# Patient Record
Sex: Female | Born: 1980 | Race: Black or African American | Hispanic: No | Marital: Single | Smoking: Former smoker
Health system: Southern US, Community
[De-identification: ages and names within clinical notes are randomized; demographics above are authoritative.]

## PROBLEM LIST (undated history)

## (undated) DIAGNOSIS — D649 Anemia, unspecified: Secondary | ICD-10-CM

## (undated) DIAGNOSIS — N939 Abnormal uterine and vaginal bleeding, unspecified: Secondary | ICD-10-CM

## (undated) DIAGNOSIS — I10 Essential (primary) hypertension: Secondary | ICD-10-CM

## (undated) DIAGNOSIS — Z720 Tobacco use: Secondary | ICD-10-CM

## (undated) DIAGNOSIS — D259 Leiomyoma of uterus, unspecified: Secondary | ICD-10-CM

## (undated) HISTORY — PX: ENDOMETRIAL ABLATION W/ NOVASURE: SUR434

---
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
## 2000-08-04 ENCOUNTER — Ambulatory Visit (HOSPITAL_COMMUNITY): Admission: RE | Admit: 2000-08-04 | Discharge: 2000-08-04 | Payer: Self-pay | Admitting: *Deleted

## 2000-09-15 ENCOUNTER — Ambulatory Visit (HOSPITAL_COMMUNITY): Admission: RE | Admit: 2000-09-15 | Discharge: 2000-09-15 | Payer: Self-pay | Admitting: *Deleted

## 2000-10-31 ENCOUNTER — Inpatient Hospital Stay (HOSPITAL_COMMUNITY): Admission: AD | Admit: 2000-10-31 | Discharge: 2000-10-31 | Payer: Self-pay | Admitting: Obstetrics

## 2000-11-08 ENCOUNTER — Ambulatory Visit (HOSPITAL_COMMUNITY): Admission: RE | Admit: 2000-11-08 | Discharge: 2000-11-08 | Payer: Self-pay | Admitting: *Deleted

## 2000-11-16 ENCOUNTER — Inpatient Hospital Stay (HOSPITAL_COMMUNITY): Admission: AD | Admit: 2000-11-16 | Discharge: 2000-11-16 | Payer: Self-pay | Admitting: Obstetrics

## 2001-01-06 ENCOUNTER — Inpatient Hospital Stay (HOSPITAL_COMMUNITY): Admission: AD | Admit: 2001-01-06 | Discharge: 2001-01-06 | Payer: Self-pay | Admitting: *Deleted

## 2001-01-08 ENCOUNTER — Inpatient Hospital Stay (HOSPITAL_COMMUNITY): Admission: AD | Admit: 2001-01-08 | Discharge: 2001-01-08 | Payer: Self-pay | Admitting: Obstetrics & Gynecology

## 2001-01-09 ENCOUNTER — Encounter: Payer: Self-pay | Admitting: *Deleted

## 2001-01-09 ENCOUNTER — Inpatient Hospital Stay (HOSPITAL_COMMUNITY): Admission: AD | Admit: 2001-01-09 | Discharge: 2001-01-13 | Payer: Self-pay | Admitting: *Deleted

## 2005-11-02 ENCOUNTER — Emergency Department (HOSPITAL_COMMUNITY): Admission: EM | Admit: 2005-11-02 | Discharge: 2005-11-03 | Payer: Self-pay | Admitting: Emergency Medicine

## 2005-11-28 ENCOUNTER — Emergency Department (HOSPITAL_COMMUNITY): Admission: AD | Admit: 2005-11-28 | Discharge: 2005-11-28 | Payer: Self-pay | Admitting: Emergency Medicine

## 2016-02-07 ENCOUNTER — Observation Stay (HOSPITAL_COMMUNITY)
Admission: EM | Admit: 2016-02-07 | Discharge: 2016-02-08 | Disposition: A | Discharge status: Home / Self Care | Payer: Self-pay | Attending: Family Medicine | Admitting: Family Medicine

## 2016-02-07 ENCOUNTER — Emergency Department (HOSPITAL_COMMUNITY): Payer: Self-pay

## 2016-02-07 ENCOUNTER — Emergency Department (INDEPENDENT_AMBULATORY_CARE_PROVIDER_SITE_OTHER)
Admission: EM | Admit: 2016-02-07 | Discharge: 2016-02-07 | Disposition: A | Discharge status: Nursing Home (Includes ICF) | Payer: Self-pay | Source: Home / Self Care | Attending: Family Medicine | Admitting: Family Medicine

## 2016-02-07 ENCOUNTER — Encounter (HOSPITAL_COMMUNITY): Payer: Self-pay | Admitting: *Deleted

## 2016-02-07 ENCOUNTER — Encounter (HOSPITAL_COMMUNITY): Payer: Self-pay

## 2016-02-07 DIAGNOSIS — N946 Dysmenorrhea, unspecified: Secondary | ICD-10-CM | POA: Insufficient documentation

## 2016-02-07 DIAGNOSIS — D500 Iron deficiency anemia secondary to blood loss (chronic): Principal | ICD-10-CM | POA: Insufficient documentation

## 2016-02-07 DIAGNOSIS — F17210 Nicotine dependence, cigarettes, uncomplicated: Secondary | ICD-10-CM | POA: Insufficient documentation

## 2016-02-07 DIAGNOSIS — D72829 Elevated white blood cell count, unspecified: Secondary | ICD-10-CM | POA: Insufficient documentation

## 2016-02-07 DIAGNOSIS — R0789 Other chest pain: Secondary | ICD-10-CM

## 2016-02-07 DIAGNOSIS — N920 Excessive and frequent menstruation with regular cycle: Secondary | ICD-10-CM | POA: Insufficient documentation

## 2016-02-07 DIAGNOSIS — I10 Essential (primary) hypertension: Secondary | ICD-10-CM | POA: Insufficient documentation

## 2016-02-07 DIAGNOSIS — R05 Cough: Secondary | ICD-10-CM | POA: Insufficient documentation

## 2016-02-07 DIAGNOSIS — R0609 Other forms of dyspnea: Secondary | ICD-10-CM

## 2016-02-07 DIAGNOSIS — D573 Sickle-cell trait: Secondary | ICD-10-CM | POA: Insufficient documentation

## 2016-02-07 DIAGNOSIS — D649 Anemia, unspecified: Secondary | ICD-10-CM

## 2016-02-07 DIAGNOSIS — D696 Thrombocytopenia, unspecified: Secondary | ICD-10-CM | POA: Insufficient documentation

## 2016-02-07 DIAGNOSIS — R000 Tachycardia, unspecified: Secondary | ICD-10-CM

## 2016-02-07 DIAGNOSIS — D509 Iron deficiency anemia, unspecified: Secondary | ICD-10-CM | POA: Insufficient documentation

## 2016-02-07 DIAGNOSIS — D259 Leiomyoma of uterus, unspecified: Secondary | ICD-10-CM | POA: Insufficient documentation

## 2016-02-07 DIAGNOSIS — R531 Weakness: Secondary | ICD-10-CM

## 2016-02-07 DIAGNOSIS — R9431 Abnormal electrocardiogram [ECG] [EKG]: Secondary | ICD-10-CM

## 2016-02-07 HISTORY — DX: Essential (primary) hypertension: I10

## 2016-02-07 LAB — CBC WITH DIFFERENTIAL/PLATELET
BASOS ABS: 0.1 10*3/uL (ref 0.0–0.1)
BASOS PCT: 1 %
EOS PCT: 2 %
Eosinophils Absolute: 0.3 10*3/uL (ref 0.0–0.7)
HEMATOCRIT: 16.2 % — AB (ref 36.0–46.0)
HEMOGLOBIN: 4.5 g/dL — AB (ref 12.0–15.0)
LYMPHS ABS: 2.1 10*3/uL (ref 0.7–4.0)
Lymphocytes Relative: 15 %
MCH: 18.1 pg — AB (ref 26.0–34.0)
MCHC: 27.8 g/dL — ABNORMAL LOW (ref 30.0–36.0)
MCV: 65.3 fL — AB (ref 78.0–100.0)
MONOS PCT: 7 %
Monocytes Absolute: 1 10*3/uL (ref 0.1–1.0)
NEUTROS ABS: 10.8 10*3/uL — AB (ref 1.7–7.7)
Neutrophils Relative %: 75 %
Platelets: 139 10*3/uL — ABNORMAL LOW (ref 150–400)
RBC: 2.48 MIL/uL — ABNORMAL LOW (ref 3.87–5.11)
RDW: 32 % — ABNORMAL HIGH (ref 11.5–15.5)
WBC: 14.3 10*3/uL — ABNORMAL HIGH (ref 4.0–10.5)

## 2016-02-07 LAB — I-STAT TROPONIN, ED: Troponin i, poc: 0 ng/mL (ref 0.00–0.08)

## 2016-02-07 LAB — BASIC METABOLIC PANEL
Anion gap: 9 (ref 5–15)
BUN: 6 mg/dL (ref 6–20)
CALCIUM: 8.8 mg/dL — AB (ref 8.9–10.3)
CO2: 21 mmol/L — AB (ref 22–32)
CREATININE: 0.69 mg/dL (ref 0.44–1.00)
Chloride: 110 mmol/L (ref 101–111)
GFR calc non Af Amer: >60 mL/min (ref >60)
Glucose, Bld: 98 mg/dL (ref 65–99)
Potassium: 4.1 mmol/L (ref 3.5–5.1)
SODIUM: 140 mmol/L (ref 135–145)

## 2016-02-07 LAB — SAVE SMEAR

## 2016-02-07 LAB — POCT I-STAT, CHEM 8
BUN: 4 mg/dL — AB (ref 6–20)
CALCIUM ION: 1.23 mmol/L (ref 1.12–1.23)
Chloride: 107 mmol/L (ref 101–111)
Creatinine, Ser: 0.7 mg/dL (ref 0.44–1.00)
GLUCOSE: 121 mg/dL — AB (ref 65–99)
HEMATOCRIT: 19 % — AB (ref 36.0–46.0)
Hemoglobin: 6.5 g/dL — CL (ref 12.0–15.0)
Potassium: 3.5 mmol/L (ref 3.5–5.1)
SODIUM: 140 mmol/L (ref 135–145)
TCO2: 23 mmol/L (ref 0–100)

## 2016-02-07 LAB — RETICULOCYTES
RBC.: 2.51 MIL/uL — AB (ref 3.87–5.11)
Retic Count, Absolute: 75.3 10*3/uL (ref 19.0–186.0)
Retic Ct Pct: 3 % (ref 0.4–3.1)

## 2016-02-07 LAB — PREPARE RBC (CROSSMATCH)

## 2016-02-07 LAB — ABO/RH: ABO/RH(D): O POS

## 2016-02-07 MED ORDER — AMLODIPINE BESYLATE 5 MG PO TABS
5.0000 mg | ORAL_TABLET | Freq: Every day | ORAL | Status: DC
  Administered 2016-02-08: 5 mg via ORAL
  Filled 2016-02-07 (×2): qty 1

## 2016-02-07 MED ORDER — ACETAMINOPHEN 325 MG PO TABS
650.0000 mg | ORAL_TABLET | Freq: Four times a day (QID) | ORAL | Status: DC | PRN
  Administered 2016-02-07: 650 mg via ORAL
  Filled 2016-02-07: qty 2

## 2016-02-07 MED ORDER — DESOGESTREL-ETHINYL ESTRADIOL 0.15-0.02/0.01 MG (21/5) PO TABS: 1.0000 | ORAL_TABLET | Freq: Every day | ORAL | Status: DC

## 2016-02-07 MED ORDER — SODIUM CHLORIDE 0.9 % IV SOLN
Freq: Once | INTRAVENOUS | Status: AC
  Administered 2016-02-07: 14:00:00 via INTRAVENOUS

## 2016-02-07 MED ORDER — SODIUM CHLORIDE 0.9 % IV SOLN
Freq: Once | INTRAVENOUS | Status: AC
  Administered 2016-02-07: 19:00:00 via INTRAVENOUS

## 2016-02-07 MED ORDER — ACETAMINOPHEN 650 MG RE SUPP
650.0000 mg | Freq: Four times a day (QID) | RECTAL | Status: DC | PRN
Start: 2016-02-07 — End: 2016-02-08

## 2016-02-07 MED ORDER — ONDANSETRON HCL 4 MG/2ML IJ SOLN: 4.0000 mg | Freq: Four times a day (QID) | INTRAMUSCULAR | Status: DC | PRN

## 2016-02-07 MED ORDER — ONDANSETRON HCL 4 MG PO TABS: 4.0000 mg | ORAL_TABLET | Freq: Four times a day (QID) | ORAL | Status: DC | PRN

## 2016-02-07 NOTE — H&P (Signed)
[facility] Admission History and Physical Service Pager: [phone]  Patient name: Cynthia Horn Medical record number: [id number] Date of birth: 10-17-1981 Age: 35 y.o. Gender: female  Primary Care Provider: No PCP Per Patient Consultants: None Code Status: Full (confirmed on admission)  Chief Complaint: fatigue, exertional dyspnea  Assessment and Plan: Cynthia Horn is a 35 y.o. female presenting with symptomatic anemia. PMH is significant for dysmenorrhagia, HTN, fibroids, sickle cell trait and tobacco abuse.   Severe acute on chronic blood loss anemia, suspected 2/2 to chronic menorrhagia + uterine fibroids Most likely chronic due to heavy periods and iron deficiency anemia without supplementation. Now concern on admit with worsening symptomatic anemia, however seems acute over past 1 week with menstrual cycle, unclear why she has not been significantly symptomatic prior. On admit, hemodynamically stable, without active CP/SOB at rest or syncopal episode. - Admit on telemetry, attending Dr. Erin Hearing.  - Continuous cardiac monitoring - Transfusion with 2 units pRBCs in process; (transfusion threshold of 7) - Check post-transfusion CBC 0500 (about 2 hours after 2nd unit finished) - Consider repeat CBC q12 hours  - Obtain peripheral smear, reticulocytes (called lab and will obtain from pre-transfusion blood samples) - Ordered iron studies (ferritin, TIBC, iron panel - post-transfusion) - Will not continue OCP, as patient has not taken for a week - Patient may need further work-up of fibroids. May ultimately want to consider hysterectomy.  H/o chronic iron deficiency anemia - See above ABLA  HTN - Stable - Continue home amlodipine 5 mg daily  Tobacco abuse: Patient has cut back to 1-2 cigarettes daily, from 6-7. She is motivated by her son's concerns for her health and because she has noticed she coughs with smoking now.  - Provide further counseling and  options for cessation aids - Concern for age 35 yr female smoker and with HTN on OCPs, would strongly recommend against given no significant improvement in menorrhagia  Discharge planning: Patient recently moved to [place] from [place] 2 weeks ago. She does not have a current gynecologist or PCP. Has been on Medicaid in the past but is unsure if she is currently covered. - Consult SW for assistance in confirming insurance status and provider options  FEN/GI: Regular diet, zofran prn Prophylaxis: SCDs (holding chemical VTE prophylaxis given acute anemia, however if Hgb stable and prolonged   Disposition: Admitted to observation on telemetry for PRBC transfusion x 2 with severe acute on chronic blood loss anemia, anticipate will need IV iron, may need additional transfusions, pending improvement discharge home.  History of Present Illness:  Cynthia Horn is a 35 y.o. female presenting with worsening fatigue and extertional dyspnea x 3 days. She initially presented to Urgent Care but was told to come to the ED for an i-stat hgb of 6.5.   She reports that symptoms started about 1 week ago with gradually worsening constellation of symptoms, including dizziness, dyspnea, chest tightness, and tachycardia on exertion (walking) but not at rest. The symptoms have been worsening for past 3-4 days with DOE (noticeable on trip to mall with son with increased walking) and have been associated with increased fatigue and sleepiness, nausea (without vomiting), headache, dizziness (worse on standing), and difficulty with prolonged standing. She also notes reduced appetite recently and had only been eating about once daily for the past few days due to nausea. She admits to symptoms of pica with eating ice regularly. She speculated that her BP was elevated and tried home remedies of eating peanut butter and  teaspoons of vinegar without improvement in symptoms, as well as continuing her amlodipine 5 mg daily.   No  prior history of blood transfusion.  She recently finished her menstrual cycle yesterday, 02/06/16. She reports a chronic history of "very heavy menstrual cycles," and had been followed by GYN Dr. Irine Seal in [place] (last visit 09/2015). She had a NovaSure ablation without improvement in dysmenorrhea. She was told she could not get an IUD due to ablation and have been on OCPs since 10/2015, again without improvement. She states that she buys super tampons and uses 50 over 3 days, as well as pads because she bleeds through. She describes her period as always having been this heavy. Also with prior diagnosis of uterine fibroids on prior ultrasounds, per patient.   She has a history of chronic anemia and has tried OTC iron supplements in the past but did not tolerate them well due to constipation and GI upset. Does eat meats, including red meat, though has fish and chicken more often.   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   In the ED, 2 units of blood were ordered, and transfusion was begun. WBC was 14.3, hgb 4.5, hct 16.2, plts 139. I-stat troponin was performed for complaint of chest tightness. This was negative, and EKG showed sinus tachycardia and borderline T-wave abnormalities that appear consistent with a repolarization abnormality.    Review Of Systems: Per HPI with the following additions: Admits to heart racing with activity, dark urine but still yellow but did admit to one episode of questionable blood tinged urine in toilet while mestruating. Denies any melena or hematochezia, falling, syncope, epistaxis, easy bleeding or bruising, abdominal pain.   Otherwise the remainder of the systems were negative.  Patient Active Problem List   Diagnosis Date Noted  . Symptomatic anemia 02/07/2016    Past Medical History: Past Medical History  Diagnosis Date  . Hypertension   History of sickle  cell trait. Denies any known prior history of DVT/PE.  Past Surgical History: Past Surgical History  Procedure Laterality Date  . Endometrial ablation w/ novasure    Surgical history of C-section without significant bleeding complications.  Social History: Social History  Substance Use Topics  . Smoking status: Current Every Day Smoker  . Smokeless tobacco: None  . Alcohol Use: Yes     Comment: occasional   Additional social history: Works as IT sales professional for [employer]. Active smoker. History of occasional marijuana use.  Please also refer to relevant sections of EMR.  Family History: No family history on file.   Family history of sister with CVA during pregnancy. No family history of DVT/PE. Aunt has lupus. No family history of DM.  Allergies and Medications: No Known Allergies No current facility-administered medications on file prior to encounter.   Current Outpatient Prescriptions on File Prior to Encounter  Medication Sig Dispense Refill  . AMLODIPINE BESYLATE PO Take 5 mg by mouth daily.       Objective: BP 136/92 mmHg  Pulse 104  Temp(Src) 98.7 F (37.1 C) (Oral)  Resp 18  Ht 4' 1.32" (1.253 m)  Wt 165 lb 12.6 oz (75.2 kg)  BMI 47.90 kg/m2  SpO2 100%  LMP 01/30/2016 Exam: General: Tired-appearing female, resting in bed with blood transfusion underway Eyes: Pale conjunctiva, under-eye circles, PERRL, EOMI. ENTM: No rhinorrhea. MMM. Neck: FROM, supple.  Cardiovascular: Slightly tachycardic, regular  rhythm, no m/r/g Chest: CTAB, speaking fluidly in complete sentences but pausing to catch her breath in between words; mild central chest wall tenderness with palpation Abdomen:+BS, S, NT, ND MSK: Normal bulk and tone. Moves all extremities spontaneously.  Skin: WWP. Brisk capillary refill.  Neuro: AOx3. No focal deficits.  Psych: Normal mood and affect.   Labs and Imaging: CBC BMET   Recent Labs Lab 02/07/16 1626  WBC 14.3*  HGB 4.5*  HCT  16.2*  PLT 139*    Recent Labs Lab 02/07/16 1626  NA 140  K 4.1  CL 110  CO2 21*  BUN 6  CREATININE 0.69  GLUCOSE 98  CALCIUM 8.8*      Cynthia Corinda Gubler, MD 02/07/2016, 11:08 PM PGY-1, [place] Intern pager: [phone], text pages welcome  Upper Level Addendum:  I have seen and evaluated this patient along with Dr. Ola Spurr and reviewed the above note, making necessary revisions in purple.  Cynthia Horn, [place], PGY-3

## 2016-02-07 NOTE — ED Notes (Signed)
C/O intermittent frontal HA with feeling of near-syncope over past 2 days with nausea.  Denies any cold sxs.

## 2016-02-07 NOTE — ED Provider Notes (Signed)
CSN: [id number]     Arrival date & time 02/07/16  1502 History   First MD Initiated Contact with Patient 02/07/16 1520     Chief Complaint  Patient presents with  . Chest Pain  . Low Hgb      (Consider location/radiation/quality/duration/timing/severity/associated sxs/prior Treatment) HPI   Cynthia Horn is a 35 y.o. female with PMH significant for HTN who presents from [facility] for symptomatic hgb 6.5.  Patient reports 2 day history of His pain, exertional SOB, lightheadedness, and headache. Occasional nausea. Denies cough, fever, chills, epistaxis, vomiting, abdominal pain, melena, hematochezia. Patient reports history of heavy menstrual cycles. LMP ended yesterday. She reports her periods are typically 7 days. She states that she has heavy bleeding with the passage of clots. She reports using super plus tampons and pads. She states that she can go through a 50 pack in 3 days. She reports she has to change her tampon at least every hour. She denies any history of DVT/PE, unilateral leg swelling, recent travel, immobilization, or surgery.   Past Medical History  Diagnosis Date  . Hypertension    Past Surgical History  Procedure Laterality Date  . Endometrial ablation w/ novasure     No family history on file. Social History  Substance Use Topics  . Smoking status: Current Every Day Smoker  . Smokeless tobacco: None  . Alcohol Use: Yes     Comment: occasional   OB History    No data available     Review of Systems All other systems negative unless otherwise stated in HPI    Allergies  Review of patient's allergies indicates no known allergies.  Home Medications   Prior to Admission medications   Medication Sig Start Date End Date Taking? Authorizing Provider  AMLODIPINE BESYLATE PO Take 5 mg by mouth daily.    Yes Historical Provider, MD  desogestrel-ethinyl estradiol (VIORELE) 0.15-0.02/0.01 MG (21/5) tablet Take 1 tablet by mouth daily.   Yes Historical Provider, MD    BP 107/65 mmHg  Pulse 98  Resp 21  SpO2 100%  LMP 01/30/2016 Physical Exam  Constitutional: She is oriented to person, place, and time. She appears well-developed and well-nourished.  Non-toxic appearance. She does not have a sickly appearance. She does not appear ill.  HENT:  Head: Normocephalic and atraumatic.  Mouth/Throat: Oropharynx is clear and moist.  Eyes: Conjunctivae are normal.  Neck: Normal range of motion. Neck supple.  Cardiovascular: Regular rhythm and normal heart sounds.  Tachycardia present.   No murmur heard. Pulmonary/Chest: Effort normal and breath sounds normal. No accessory muscle usage or stridor. No respiratory distress. She has no wheezes. She has no rhonchi. She has no rales.  Abdominal: Soft. Bowel sounds are normal. She exhibits no distension. There is no tenderness.  Musculoskeletal: Normal range of motion.  Lymphadenopathy:    She has no cervical adenopathy.  Neurological: She is alert and oriented to person, place, and time.  Speech clear without dysarthria.  Skin: Skin is warm and dry. There is pallor.  Psychiatric: She has a normal mood and affect. Her behavior is normal.    ED Course  Procedures (including critical care time)  CRITICAL CARE Performed by: Gloriann Loan   Total critical care time: 30 minutes  Critical care time was exclusive of separately billable procedures and treating other patients.  Critical care was necessary to treat or prevent imminent or life-threatening deterioration.  Critical care was time spent personally by me on the following activities: development of treatment  plan with patient and/or surrogate as well as nursing, discussions with consultants, evaluation of patient's response to treatment, examination of patient, obtaining history from patient or surrogate, ordering and performing treatments and interventions, ordering and review of laboratory studies, ordering and review of radiographic studies, pulse oximetry  and re-evaluation of patient's condition.  Labs Review Labs Reviewed  BASIC METABOLIC PANEL - Abnormal; Notable for the following:    CO2 21 (*)    Calcium 8.8 (*)    All other components within normal limits  CBC WITH DIFFERENTIAL/PLATELET  I-STAT TROPOININ, ED  PREPARE RBC (CROSSMATCH)  TYPE AND SCREEN    Imaging Review Dg Chest 2 View  02/07/2016  CLINICAL DATA:  Three-day history of shortness of breath with chest tightness EXAM: CHEST  2 VIEW COMPARISON:  None. FINDINGS: Lungs are clear. Heart size and pulmonary vascularity are normal. No adenopathy. No pneumothorax. No bone lesions. IMPRESSION: No abnormality noted. Electronically Signed   By: Lowella Grip III M.D.   On: 02/07/2016 16:28   I have personally reviewed and evaluated these images and lab results as part of my medical decision-making.   EKG Interpretation   Date/Time:  Saturday February 07 2016 15:19:43 EDT Ventricular Rate:  100 PR Interval:  141 QRS Duration: 88 QT Interval:  343 QTC Calculation: 442 R Axis:   61 Text Interpretation:  Sinus tachycardia Borderline T wave abnormalities No  significant change since last tracing Confirmed by [place]  MD, [place]  ([id number]) on 02/07/2016 5:15:02 PM      MDM   Final diagnoses:  Symptomatic anemia   Patient presents with symptomatic anemia, hgb at [facility] 6.5. Hgb 4.5 here.  EKG shows shows sinus tachycardia with t wave abnormalities.  Troponin 0.00.  Likely secondary to hgb of 4.5.  CXR negative.  On exam, she is tachycardic with normal heart sounds, lungs CTAB, abdomen soft and benign.  Plan to transfuse 2 units PRBCs and admit to medicine, Dr. Erin Hearing.  Case has been discussed with Dr. Wilson Singer who agrees with the above plan for admission.       Gloriann Loan, PA-C 02/07/16 [place], MD 02/12/16 [phone]

## 2016-02-07 NOTE — ED Provider Notes (Signed)
CSN: [id number]     Arrival date & time 02/07/16  1301 History   First MD Initiated Contact with Patient 02/07/16 1313     Chief Complaint  Patient presents with  . Headache  . Dizziness   (Consider location/radiation/quality/duration/timing/severity/associated sxs/prior Treatment) HPI Comments: 35 year old female with several complaints. She complains of dizziness, frontal headache, lightheadedness for 2 days. Most of the symptoms are intermittent. She states she is also under a lot of stress. She has a history of hypertension and her blood pressure was taken by a relative who was a CNA and told that her blood pressure was elevated. She had run out of her amlodipine and not been taking them for some time. She has recently started taking the amlodipine 5 mg. She denies upper respiratory symptoms. Recently she developed dyspnea on exertion as well has chest pain described as tightness. The symptoms primarily occur after walking a long distance. She is a smoker daily, and has a history of hypertension with prolonged periods of no treatment.   Past Medical History  Diagnosis Date  . Hypertension    Past Surgical History  Procedure Laterality Date  . Endometrial ablation w/ novasure     No family history on file. Social History  Substance Use Topics  . Smoking status: Current Every Day Smoker  . Smokeless tobacco: None  . Alcohol Use: Yes     Comment: occasional   OB History    No data available     Review of Systems  Constitutional: Positive for activity change. Negative for fever, chills and diaphoresis.  HENT: Negative for congestion, ear pain, postnasal drip, rhinorrhea, sore throat and trouble swallowing.   Eyes: Negative for photophobia, pain, discharge and redness.       She states sometimes she might have a brief period of blurred vision.  Respiratory: Negative for cough, choking, wheezing and stridor.   Cardiovascular: Positive for chest pain. Negative for palpitations and  leg swelling.       Complains of DOE and chest heaviness with exertion particularly after long walks.  Gastrointestinal: Positive for nausea. Negative for vomiting and abdominal pain.  Genitourinary: Negative.   Musculoskeletal: Negative for myalgias, back pain, joint swelling, gait problem, neck pain and neck stiffness.  Skin: Negative for color change and rash.  Neurological: Positive for dizziness, light-headedness and headaches. Negative for tremors, seizures, syncope, speech difficulty, weakness and numbness.  Psychiatric/Behavioral: Negative for confusion and agitation.    Allergies  Review of patient's allergies indicates no known allergies.  Home Medications   Prior to Admission medications   Medication Sig Start Date End Date Taking? Authorizing Provider  AMLODIPINE BESYLATE PO Take by mouth daily.   Yes Historical Provider, MD   Meds Ordered and Administered this Visit   Medications  0.9 %  sodium chloride infusion ( Intravenous New Bag/Given 02/07/16 1422)    BP 136/84 mmHg  Pulse 116  Temp(Src) 98.2 F (36.8 C) (Oral)  Resp 18  SpO2 100%  LMP 02/06/2016 (Exact Date) Orthostatic VS for the past 24 hrs:  BP- Lying Pulse- Lying BP- Sitting Pulse- Sitting BP- Standing at 0 minutes Pulse- Standing at 0 minutes  02/07/16 1331 129/76 mmHg 113 129/79 mmHg 114 111/76 mmHg 119    Physical Exam  Constitutional: She is oriented to person, place, and time. She appears well-developed and well-nourished. No distress.  HENT:  Head: Normocephalic and atraumatic.  Mouth/Throat: Oropharynx is clear and moist. No oropharyngeal exudate.  Bilateral TMs are obscured by wax.  Manual pressure across the forehead and right temple produces tenderness.    Eyes: Conjunctivae and EOM are normal. Pupils are equal, round, and reactive to light.  No nystagmus. Pupils small but reactive to light.  Neck: Normal range of motion. Neck supple. No thyromegaly present.  No cervical tenderness.  Exhibits full range of motion head and neck.  Cardiovascular: Regular rhythm, normal heart sounds and intact distal pulses.   Apical pulse tachycardia at 116.  Pulmonary/Chest: Effort normal and breath sounds normal. No respiratory distress. She has no wheezes. She has no rales.  Abdominal: Soft. There is no tenderness. There is no rebound and no guarding.  Musculoskeletal: Normal range of motion. She exhibits no edema or tenderness.  Lymphadenopathy:    She has no cervical adenopathy.  Neurological: She is alert and oriented to person, place, and time. No cranial nerve deficit.  Skin: Skin is warm and dry. She is not diaphoretic.  Psychiatric: She has a normal mood and affect.  Nursing note and vitals reviewed.   ED Course  Procedures (including critical care time)  Labs Review Labs Reviewed  POCT I-STAT, CHEM 8 - Abnormal; Notable for the following:    BUN 4 (*)    Glucose, Bld 121 (*)    Hemoglobin 6.5 (*)    HCT 19.0 (*)    All other components within normal limits    Imaging Review No results found. Results for orders placed or performed during the hospital encounter of 02/07/16  I-STAT, chem 8  Result Value Ref Range   Sodium 140 135 - 145 mmol/L   Potassium 3.5 3.5 - 5.1 mmol/L   Chloride 107 101 - 111 mmol/L   BUN 4 (L) 6 - 20 mg/dL   Creatinine, Ser 0.70 0.44 - 1.00 mg/dL   Glucose, Bld 121 (H) 65 - 99 mg/dL   Calcium, Ion 1.23 1.12 - 1.23 mmol/L   TCO2 23 0 - 100 mmol/L   Hemoglobin 6.5 (LL) 12.0 - 15.0 g/dL   HCT 19.0 (L) 36.0 - 46.0 %   Comment NOTIFIED PHYSICIAN     ED ECG REPORT   Date: 02/07/2016  Rate: 104  Rhythm: sinus tachycardia  QRS Axis: normal  Intervals: normal  ST/T Wave abnormalities: nonspecific T wave changes Twave inversions , III, aVF, V4, V5, V6.  Conduction Disutrbances:none  Narrative Interpretation:   Old EKG Reviewed: none available  I have personally reviewed the EKG tracing and agree with the computerized printout as  noted.   Visual Acuity Review  Right Eye Distance:   Left Eye Distance:   Bilateral Distance:    Right Eye Near:   Left Eye Near:    Bilateral Near:         MDM   1. Anemia, unspecified anemia type   2. Weakness   3. Tachycardia   4. Other chest pain   5. DOE (dyspnea on exertion)   6. Essential hypertension   7. Abnormal finding on EKG    Patient with multiple complaints including dizziness, lightheadedness, headache, DOE, chest tightness associated with  tachycardia. History of hypertension and daily tobacco use. She is anemic with a hemoglobin of 6.5 and hematocrit of 19. There are EKG changes consisting of inverted T waves in the inferior and lateral chest leads. No ST segment changes. Patient is stable and in no distress. Relaxed posturing. IV, O2, monitor and transfer via [facility].   Janne Napoleon, NP 02/07/16 1435

## 2016-02-07 NOTE — ED Notes (Signed)
Report called to Charge RN per Sophronia Simas, CMA.

## 2016-02-07 NOTE — ED Notes (Signed)
PER CARELINK: pt sent from [facility]. She reports midsternal CP, exertional SOB, frontal HA, and lightheadedness, onset 2 days ago. EMS reports pt was tachycardic with HR in 130s when moving from stretcher into the bed. Pts hgb at [facility] 6.5. BP: 98/50 w/ hx of HTN and has not taken her BP meds "in a while." HR-102

## 2016-02-07 NOTE — ED Notes (Signed)
[facility] has been notified. 

## 2016-02-08 LAB — CBC
HEMATOCRIT: 23.2 % — AB (ref 36.0–46.0)
HEMOGLOBIN: 7.4 g/dL — AB (ref 12.0–15.0)
MCH: 22.4 pg — AB (ref 26.0–34.0)
MCHC: 31.9 g/dL (ref 30.0–36.0)
MCV: 70.3 fL — AB (ref 78.0–100.0)
Platelets: 142 10*3/uL — ABNORMAL LOW (ref 150–400)
RBC: 3.3 MIL/uL — ABNORMAL LOW (ref 3.87–5.11)
WBC: 12.4 10*3/uL — ABNORMAL HIGH (ref 4.0–10.5)

## 2016-02-08 LAB — TYPE AND SCREEN
ABO/RH(D): O POS
Antibody Screen: NEGATIVE
UNIT DIVISION: 0
Unit division: 0

## 2016-02-08 LAB — IRON AND TIBC
IRON: 18 ug/dL — AB (ref 28–170)
Saturation Ratios: 3 % — ABNORMAL LOW (ref 10.4–31.8)
TIBC: 535 ug/dL — AB (ref 250–450)
UIBC: 517 ug/dL

## 2016-02-08 LAB — FERRITIN: Ferritin: 4 ng/mL — ABNORMAL LOW (ref 11–307)

## 2016-02-08 MED ORDER — FERUMOXYTOL INJECTION 510 MG/17 ML
510.0000 mg | Freq: Once | INTRAVENOUS | Status: AC
  Administered 2016-02-08: 510 mg via INTRAVENOUS
  Filled 2016-02-08: qty 17

## 2016-02-08 MED ORDER — NORGESTIMATE-ETH ESTRADIOL 0.25-35 MG-MCG PO TABS: 1.0000 | ORAL_TABLET | Freq: Every day | ORAL | Status: DC

## 2016-02-08 NOTE — Discharge Summary (Signed)
[facility] Discharge Summary  Patient name: Cynthia Horn Medical record number: [id number] Date of birth: December 11, 1980 Age: 35 y.o. Gender: female Date of Admission: 02/07/2016  Date of Discharge: 02/08/16 Admitting Physician: Lind Covert, MD  Primary Care Provider: No PCP Per Patient Consultants: none  Indication for Hospitalization: symptomatic anemia  Discharge Diagnoses/Problem List:  Symptomatic Anemia  Chronic Menorrhagia and Uterine Fibroids  Iron Deficiency Anemia HTN Leukocytosis Mild Thrombocytopenia Tobacco Use  Disposition: home   Discharge Condition: improved and stable  Discharge Exam: please refer to progress note from day of discharge   Brief Hospital Course:  Cynthia Horn is a 35 y.o. female presenting with worsening fatigue and extertional dyspnea x 3 days. She initially presented to Urgent Care but was told to come to the ED for an i-stat hgb of 6.5.   She reports that symptoms started about 1 week ago with gradually worsening constellation of symptoms, including dizziness, dyspnea, chest tightness, and tachycardia on exertion (walking) but not at rest. The symptoms have been worsening for past 3-4 days with DOE (noticeable on trip to mall with son with increased walking) and have been associated with increased fatigue and sleepiness, nausea (without vomiting), headache, dizziness (worse on standing), and difficulty with prolonged standing. She also notes reduced appetite recently and had only been eating about once daily for the past few days due to nausea. She admits to symptoms of pica with eating ice regularly.  No prior history of blood transfusion.  She recently finished her menstrual cycle on 02/06/16. She reports a chronic history of "very heavy menstrual cycles," and had been followed by GYN Dr. Irine Seal in [place] (last visit 09/2015). She had a NovaSure ablation without improvement in dysmenorrhea. She was told  she could not get an IUD due to ablation and have been on OCPs since 10/2015, again without improvement. Reports not using OCP for the past week because she no longer has insurance. She states that she buys super tampons and uses 50 over 3 days, as well as pads because she bleeds through. She describes her period as always having been this heavy. Also with prior diagnosis of uterine fibroids on prior ultrasounds, per patient.   She has a history of chronic anemia and has tried OTC iron supplements in the past but did not tolerate them well due to constipation and GI upset. Does eat meats, including red meat, though has fish and chicken more often.  Severe acute on chronic blood loss anemia, suspected 2/2 to chronic menorrhagia + uterine fibroids  Hgb in the ED was 4.2. Patient was tachycardic (EKG showed sinus tachycardia with t wave abnormalities thought to be secondary to anemia; troponin-i was 0.0). CXR was unremarkable. Patient S/p transfusion of 2 units pRBC with post-transfusion hgb of 7.4. Patient's fatigue and dyspnea on exertion resolved after transfusion.  Since patient currently does not have insurance, it was thought that it may take her a while to obtain a referral to GYN. She was given the option for Depoprovera or OCPs to manage menorrhagia. Patient opted for OCPs. She was prescribed with Sprintec. Discussed the risk of continuing tobacco use with OCP use; patient is interested in quitting. Also discussed the use of NSAIDs a few days prior to period to help decrease bleeding.   Chronic Iron Deficiency Anemia: Iron studies were significant for Iron 18, TIBC 535, Saturation 3%, Ferritin 4. Reticulocytes were abnormally normal at 3%, likely due to lack of stores to produce RBCs.Patient received  IV Iron (Feraheme x 1 dose). Patient reports she does not tolerate PO iron.   HTN: Blood pressures were overall stable. Patient was continued on her home Amlodipine.   Leukocytosis:  WBC 14.3 on  admission and trended down 12.4. Patient was afebrile during hospitalization and showed no clinical signs/symptoms of infection.   Tobacco Use:  Patient has cut back to 1-2 cigarettes daily, from 6-7. She is motivated by her son's concerns for her health and because she has noticed she coughs with smoking now.    Issues for Follow Up:  - referral to GYN: patient has had ablation for menorrhagia which did not improve symptoms. Due to ablation, patient is not a candidate for IUD.  - consider repeat iron studies; patient will likely need more iron supplementation but reports she does not tolerate PO iron   Significant Procedures:none  Significant Labs and Imaging:   Recent Labs Lab 02/07/16 1357 02/07/16 1626 02/08/16 0549  WBC  --  14.3* 12.4*  HGB 6.5* 4.5* 7.4*  HCT 19.0* 16.2* 23.2*  PLT  --  139* 142*    Recent Labs Lab 02/07/16 1357 02/07/16 1626  NA 140 140  K 3.5 4.1  CL 107 110  CO2  --  21*  GLUCOSE 121* 98  BUN 4* 6  CREATININE 0.70 0.69  CALCIUM  --  8.8*   CXR: normal  Results/Tests Pending at Time of Discharge: None  Discharge Medications:    Medication List    STOP taking these medications        VIORELE 0.15-0.02/0.01 MG (21/5) tablet  Generic drug:  desogestrel-ethinyl estradiol      TAKE these medications        AMLODIPINE BESYLATE PO  Take 5 mg by mouth daily.     norgestimate-ethinyl estradiol 0.25-35 MG-MCG tablet  Commonly known as:  ORTHO-CYCLEN,SPRINTEC,PREVIFEM  Take 1 tablet by mouth daily.        Discharge Instructions: Please refer to Patient Instructions section of EMR for full details.  Patient was counseled important signs and symptoms that should prompt return to medical care, changes in medications, dietary instructions, activity restrictions, and follow up appointments.   Follow-Up Appointments: Follow-up Information    Please follow up.   Why:  Please make a hospital follow up visit soon when you choose a primary care  provider.       Follow up with [facility].   Why:  Go to the clinic any weekday morning from 9-10 and ask for appointments to secure a primary care physician, follow up medica care, and to meet with Navigator to secure insurance.   Contact information:   [place] [phone]      Smiley Houseman, MD 02/08/2016, 12:35 PM PGY-1, [place]

## 2016-02-08 NOTE — Care Management Note (Signed)
Case Management Note  Patient Details  Name: Auden Wettstein MRN: [id number] Date of Birth: 1981-10-07  Subjective/Objective:                  anemia in the setting of chronic menorrhagia  Action/Plan: Discharge planning Expected Discharge Date:  02/08/16               Expected Discharge Plan:  Home/Self Care  In-House Referral:     Discharge planning Services  CM Consult, [facility]  Post Acute Care Choice:    Choice offered to:  Patient  DME Arranged:    DME Agency:     HH Arranged:    [place] Agency:     Status of Service:  Completed, signed off  Medicare Important Message Given:    Date Medicare IM Given:    Medicare IM give by:    Date Additional Medicare IM Given:    Additional Medicare Important Message give by:     If discussed at [place] of Stay Meetings, dates discussed:    Additional Comments: CM met with pt in room.  Pt has recently moved from [place] and is unemployed at this time.  CM gave pt [place] pamphlet and pt verbalized understanding she will [facility] any weekday morning this week from 9-10am and ask for: AN APPT TO SECURE A PCP; AN APPT TO WITH A NAVIGATOR TO SECURE INSURANCE; AN APPT FOR FOLLOW UP MEDICAL CARE.  No other CM needs were communicated. Dellie Catholic, RN 02/08/2016, 12:29 PM

## 2016-02-08 NOTE — Discharge Instructions (Signed)
You were hospitalized for shortness of breath which was due to anemia. Your hemoglobin was very low and you required blood transfusions (2 units). Your hemoglobin increased appropriately. You were also given IV iron because your iron stores were low. We prescribed you a different birth control pill, Sprintec which should be less expensive. It is important that you stop smoking if you are on birth control pills.  Please make a hospital follow up visit soon after you choose a primary care provider.

## 2016-02-08 NOTE — Progress Notes (Signed)
Pt given discharge instructions, prescriptions, and care notes. Pt verbalized understanding AEB no further questions or concerns at this time. IV was discontinued, no redness, pain, or swelling noted at this time. Telemetry discontinued and Centralized Telemetry was notified. Pt left the floor via wheelchair with staff in stable condition. 

## 2016-02-08 NOTE — Progress Notes (Signed)
Family Medicine Teaching Service Daily Progress Note Intern Pager: [phone]  Patient name: Ardalia Neumeister Medical record number: [id number] Date of birth: 1981-06-23 Age: 35 y.o. Gender: female  Primary Care Provider: No PCP Per Patient Consultants: none Code Status: FULL  Pt Overview and Major Events to Date:  3/18: admitted for symptomatic anemia in the setting of chronic menorrhagia   Assessment and Plan: Princetta Mrotek is a 35 y.o. female presenting with symptomatic anemia. PMH is significant for dysmenorrhagia, HTN, fibroids, sickle cell trait and tobacco abuse.   Severe acute on chronic blood loss anemia, suspected 2/2 to chronic menorrhagia + uterine fibroids Most likely chronic due to heavy periods and iron deficiency anemia without supplementation. S/p transfusion of 2 units pRBC with post-transfusion hgb of 7.4. Reticulocytes wnl at 3 which is abnormal in this setting; likely due to low iron stores: Iron 18, TIBC 535, Saturation 3%, Ferritin 4.   - Continuous cardiac monitoring - Peripheral smear: large platelets  - will prescribe Sprintec; advised to quit smoking - Patient may need further work-up of fibroids. May ultimately want to consider hysterectomy  H/o chronic iron deficiency anemia: Iron 18, TIBC 535, Saturation 3%, Ferritin 4.  - IV iron x1  HTN - overall stable - Continue home amlodipine 5 mg daily  Leukocytosis: 14.3 on admission to 12.4. Afebrile. No clinical signs of infection - will monitor   Thrombocytopenia, improving: 139 on admission to 142. No current bleeding.   Tobacco abuse: Patient has cut back to 1-2 cigarettes daily, from 6-7. She is motivated by her son's concerns for her health and because she has noticed she coughs with smoking now.  - Provide further counseling and options for cessation aids - Concern for age 35 yr female smoker and with HTN on OCPs, would strongly recommend against given no significant improvement in menorrhagia  Discharge  planning: Patient recently moved to [place] from [place] 2 weeks ago. She does not have a current gynecologist or PCP. Has been on Medicaid in the past but is unsure if she is currently covered. - Consult SW for assistance in confirming insurance status and provider options - discharge today   FEN/GI: Regular diet, zofran prn Prophylaxis: SCDs (holding chemical VTE prophylaxis given acute anemia, however if Hgb stable  Disposition: Admitted to observation on telemetry for PRBC transfusion x 2 with severe acute on chronic blood loss anemia, anticipate will need IV iron, may need additional transfusions, pending improvement discharge home.  Subjective:  Feels much better; fatigue has improved. No SOB with ambulation to the bathroom. Last day of period was 3/17. Usually period is monthly and lasts 1 week (heavy). No family history or personal history of bleeding disorders.  Patient does not think she was tested for bleeding disorder. Denies easy bleeding (gums) or easy bruising.   Objective: Temp:  [98.1 F (36.7 C)-99.1 F (37.3 C)] 98.1 F (36.7 C) (03/19 0543) Pulse Rate:  [86-116] 90 (03/19 0543) Resp:  [16-23] 16 (03/19 0543) BP: (104-151)/(60-92) 104/60 mmHg (03/19 0543) SpO2:  [100 %] 100 % (03/19 0543) Weight:  [75.2 kg (165 lb 12.6 oz)] 75.2 kg (165 lb 12.6 oz) (03/18 2040) Physical Exam: General: NAD Cardiovascular: RRR, no m/r/g Respiratory: normal effort, CTAB Abdomen: soft, NT, ND, + BS Extremities: no LE edema, no calf tenderness Skin: good cap refill.   Laboratory:  Recent Labs Lab 02/07/16 1357 02/07/16 1626  WBC  --  14.3*  HGB 6.5* 4.5*  HCT 19.0* 16.2*  PLT  --  139*  Recent Labs Lab 02/07/16 1357 02/07/16 1626  NA 140 140  K 3.5 4.1  CL 107 110  CO2  --  21*  BUN 4* 6  CREATININE 0.70 0.69  CALCIUM  --  8.8*  GLUCOSE 121* 98   Iron Studies: Iron 18, TIBC 535, Saturation 3%, Ferritin 4.    CXR; 3/18:  FINDINGS: Lungs are clear. Heart  size and pulmonary vascularity are normal. No adenopathy. No pneumothorax. No bone lesions.  IMPRESSION: No abnormality noted.   Smiley Houseman, MD 02/08/2016, 6:45 AM PGY-1, [place] Intern pager: [phone], text pages welcome

## 2016-02-16 ENCOUNTER — Ambulatory Visit: Payer: Self-pay | Attending: Internal Medicine

## 2016-05-09 ENCOUNTER — Other Ambulatory Visit: Payer: Self-pay

## 2016-05-09 ENCOUNTER — Emergency Department (HOSPITAL_COMMUNITY)
Admission: EM | Admit: 2016-05-09 | Discharge: 2016-05-09 | Disposition: A | Discharge status: Home / Self Care | Payer: Medicaid Other | Attending: Emergency Medicine | Admitting: Emergency Medicine

## 2016-05-09 ENCOUNTER — Encounter (HOSPITAL_COMMUNITY): Payer: Self-pay | Admitting: Emergency Medicine

## 2016-05-09 DIAGNOSIS — N939 Abnormal uterine and vaginal bleeding, unspecified: Secondary | ICD-10-CM | POA: Diagnosis not present

## 2016-05-09 DIAGNOSIS — D649 Anemia, unspecified: Secondary | ICD-10-CM | POA: Insufficient documentation

## 2016-05-09 DIAGNOSIS — R42 Dizziness and giddiness: Secondary | ICD-10-CM | POA: Diagnosis not present

## 2016-05-09 DIAGNOSIS — F17200 Nicotine dependence, unspecified, uncomplicated: Secondary | ICD-10-CM | POA: Insufficient documentation

## 2016-05-09 DIAGNOSIS — I10 Essential (primary) hypertension: Secondary | ICD-10-CM | POA: Insufficient documentation

## 2016-05-09 HISTORY — DX: Anemia, unspecified: D64.9

## 2016-05-09 LAB — CBC WITH DIFFERENTIAL/PLATELET
BASOS PCT: 0 %
Basophils Absolute: 0 10*3/uL (ref 0.0–0.1)
EOS PCT: 4 %
Eosinophils Absolute: 0.4 10*3/uL (ref 0.0–0.7)
HEMATOCRIT: 27.2 % — AB (ref 36.0–46.0)
Hemoglobin: 8 g/dL — ABNORMAL LOW (ref 12.0–15.0)
LYMPHS ABS: 1.3 10*3/uL (ref 0.7–4.0)
Lymphocytes Relative: 13 %
MCH: 19.7 pg — AB (ref 26.0–34.0)
MCHC: 29.4 g/dL — ABNORMAL LOW (ref 30.0–36.0)
MCV: 66.8 fL — AB (ref 78.0–100.0)
MONO ABS: 0.9 10*3/uL (ref 0.1–1.0)
Monocytes Relative: 9 %
Neutro Abs: 7.6 10*3/uL (ref 1.7–7.7)
Neutrophils Relative %: 74 %
Platelets: 617 10*3/uL — ABNORMAL HIGH (ref 150–400)
RBC: 4.07 MIL/uL (ref 3.87–5.11)
RDW: 21.9 % — AB (ref 11.5–15.5)
WBC: 10.2 10*3/uL (ref 4.0–10.5)

## 2016-05-09 LAB — BASIC METABOLIC PANEL
ANION GAP: 7 (ref 5–15)
BUN: 10 mg/dL (ref 6–20)
CALCIUM: 9.4 mg/dL (ref 8.9–10.3)
CO2: 23 mmol/L (ref 22–32)
Chloride: 108 mmol/L (ref 101–111)
Creatinine, Ser: 0.8 mg/dL (ref 0.44–1.00)
GFR calc Af Amer: >60 mL/min (ref >60)
GFR calc non Af Amer: >60 mL/min (ref >60)
GLUCOSE: 100 mg/dL — AB (ref 65–99)
POTASSIUM: 3.7 mmol/L (ref 3.5–5.1)
Sodium: 138 mmol/L (ref 135–145)

## 2016-05-09 LAB — I-STAT BETA HCG BLOOD, ED (MC, WL, AP ONLY)

## 2016-05-09 MED ORDER — MEGESTROL ACETATE 40 MG PO TABS: ORAL_TABLET | ORAL | Status: DC

## 2016-05-09 MED ORDER — MEGESTROL ACETATE 40 MG PO TABS
40.0000 mg | ORAL_TABLET | Freq: Once | ORAL | Status: AC
Start: 2016-05-09 — End: 2016-05-09
  Administered 2016-05-09: 40 mg via ORAL
  Filled 2016-05-09: qty 1

## 2016-05-09 NOTE — ED Notes (Signed)
Pt sts dizziness and vaginal bleeding since Friday; pt sts hx of similar when blood counts were low

## 2016-05-09 NOTE — Discharge Instructions (Signed)
Continue taking iron, continue to stay hydrated. Take Megace as prescribed. Follow-up with GYN doctor as scheduled. Return if any issues.   Anemia, Nonspecific Anemia is a condition in which the concentration of red blood cells or hemoglobin in the blood is below normal. Hemoglobin is a substance in red blood cells that carries oxygen to the tissues of the body. Anemia results in not enough oxygen reaching these tissues.  CAUSES  Common causes of anemia include:   Excessive bleeding. Bleeding may be internal or external. This includes excessive bleeding from periods (in women) or from the intestine.   Poor nutrition.   Chronic kidney, thyroid, and liver disease.  Bone marrow disorders that decrease red blood cell production.  Cancer and treatments for cancer.  HIV, AIDS, and their treatments.  Spleen problems that increase red blood cell destruction.  Blood disorders.  Excess destruction of red blood cells due to infection, medicines, and autoimmune disorders. SIGNS AND SYMPTOMS   Minor weakness.   Dizziness.   Headache.  Palpitations.   Shortness of breath, especially with exercise.   Paleness.  Cold sensitivity.  Indigestion.  Nausea.  Difficulty sleeping.  Difficulty concentrating. Symptoms may occur suddenly or they may develop slowly.  DIAGNOSIS  Additional blood tests are often needed. These help your health care provider determine the best treatment. Your health care provider will check your stool for blood and look for other causes of blood loss.  TREATMENT  Treatment varies depending on the cause of the anemia. Treatment can include:   Supplements of iron, vitamin 123456, or folic acid.   Hormone medicines.   A blood transfusion. This may be needed if blood loss is severe.   Hospitalization. This may be needed if there is significant continual blood loss.   Dietary changes.  Spleen removal. HOME CARE INSTRUCTIONS Keep all follow-up  appointments. It often takes many weeks to correct anemia, and having your health care provider check on your condition and your response to treatment is very important. SEEK IMMEDIATE MEDICAL CARE IF:   You develop extreme weakness, shortness of breath, or chest pain.   You become dizzy or have trouble concentrating.  You develop heavy vaginal bleeding.   You develop a rash.   You have bloody or black, tarry stools.   You faint.   You vomit up blood.   You vomit repeatedly.   You have abdominal pain.  You have a fever or persistent symptoms for more than 2-3 days.   You have a fever and your symptoms suddenly get worse.   You are dehydrated.  MAKE SURE YOU:  Understand these instructions.  Will watch your condition.  Will get help right away if you are not doing well or get worse.   This information is not intended to replace advice given to you by your health care provider. Make sure you discuss any questions you have with your health care provider.   Document Released: 12/16/2004 Document Revised: 07/11/2013 Document Reviewed: 05/04/2013 Elsevier Interactive Patient Education 2016 Elsevier Inc.    Abnormal Uterine Bleeding Abnormal uterine bleeding means bleeding from the vagina that is not your normal menstrual period. This can be:  Bleeding or spotting between periods.  Bleeding after sex (sexual intercourse).  Bleeding that is heavier or more than normal.  Periods that last longer than usual.  Bleeding after menopause. There are many problems that may cause this. Treatment will depend on the cause of the bleeding. Any kind of bleeding that is not normal  should be reviewed by your doctor.  HOME CARE Watch your condition for any changes. These actions may lessen any discomfort you are having:  Do not use tampons or douches as told by your doctor.  Change your pads often. You should get regular pelvic exams and Pap tests. Keep all appointments  for tests as told by your doctor. GET HELP IF:  You are bleeding for more than 1 week.  You feel dizzy at times. GET HELP RIGHT AWAY IF:   You pass out.  You have to change pads every 15 to 30 minutes.  You have belly pain.  You have a fever.  You become sweaty or weak.  You are passing large blood clots from the vagina.  You feel sick to your stomach (nauseous) and throw up (vomit). MAKE SURE YOU:  Understand these instructions.  Will watch your condition.  Will get help right away if you are not doing well or get worse.   This information is not intended to replace advice given to you by your health care provider. Make sure you discuss any questions you have with your health care provider.   Document Released: 09/05/2009 Document Revised: 11/13/2013 Document Reviewed: 06/07/2013 Elsevier Interactive Patient Education [employer].

## 2016-05-09 NOTE — ED Provider Notes (Signed)
CSN: [id number]     Arrival date & time 05/09/16  1605 History   First MD Initiated Contact with Patient 05/09/16 1724     Chief Complaint  Patient presents with  . Dizziness  . Vaginal Bleeding     (Consider location/radiation/quality/duration/timing/severity/associated sxs/prior Treatment) HPI Cynthia Horn is a 35 y.o. female with history of hypertension and anemia, presents to emergency department complaining of dizziness. Patient states she has been feeling slightly dizzy and lightheaded for the last 2 days. She reports history of heavy vaginal bleeding with prior anemia and blood transfusions. She was last transfused 3 months ago. Pt used to be followed by an OB/GYN in [place] and had a Hysteroscopy, D&C, Novasure Endometrial Ablation performed on 06/04/15. States it did not help with bleeding. She has an appointment with OB/GYN next week. She states that she started her menstrual cycle 2 days ago and normally last for at least 6 or 7 days. She states that when she started feeling dizzy she was worried that her blood counts are dropping again. Patient denies any headache, no changes in vision, no chest pain or shortness of breath, no nausea or vomiting, no abdominal cramping. Her bleeding at this time is normal for her. Denies pregnancy.    Past Medical History  Diagnosis Date  . Hypertension   . Anemia    Past Surgical History  Procedure Laterality Date  . Endometrial ablation w/ novasure     History reviewed. No pertinent family history. Social History  Substance Use Topics  . Smoking status: Current Every Day Smoker  . Smokeless tobacco: None  . Alcohol Use: Yes     Comment: occasional   OB History    No data available     Review of Systems  Constitutional: Negative for fever and chills.  Respiratory: Negative for cough, chest tightness and shortness of breath.   Cardiovascular: Negative for chest pain, palpitations and leg swelling.  Gastrointestinal: Negative for  nausea, vomiting, abdominal pain and diarrhea.  Genitourinary: Positive for vaginal bleeding. Negative for dysuria, flank pain, vaginal discharge, vaginal pain and pelvic pain.  Musculoskeletal: Negative for myalgias, arthralgias, neck pain and neck stiffness.  Skin: Negative for rash.  Neurological: Positive for dizziness and light-headedness. Negative for weakness and headaches.  All other systems reviewed and are negative.     Allergies  Review of patient's allergies indicates no known allergies.  Home Medications   Prior to Admission medications   Medication Sig Start Date End Date Taking? Authorizing Provider  AMLODIPINE BESYLATE PO Take 5 mg by mouth daily.     Historical Provider, MD  norgestimate-ethinyl estradiol (ORTHO-CYCLEN,SPRINTEC,PREVIFEM) 0.25-35 MG-MCG tablet Take 1 tablet by mouth daily. 02/08/16   Smiley Houseman, MD   There were no vitals taken for this visit. Physical Exam  Constitutional: She is oriented to person, place, and time. She appears well-developed and well-nourished. No distress.  HENT:  Head: Normocephalic.  Eyes: Conjunctivae are normal.  Neck: Neck supple.  Cardiovascular: Normal rate, regular rhythm and normal heart sounds.   Pulmonary/Chest: Effort normal and breath sounds normal. No respiratory distress. She has no wheezes. She has no rales.  Abdominal: Soft. Bowel sounds are normal. She exhibits no distension. There is no tenderness. There is no rebound.  Musculoskeletal: She exhibits no edema.  Neurological: She is alert and oriented to person, place, and time.  Skin: Skin is warm and dry.  Psychiatric: She has a normal mood and affect. Her behavior is normal.  Nursing note  and vitals reviewed.   ED Course  Procedures (including critical care time) Labs Review Labs Reviewed  CBC WITH DIFFERENTIAL/PLATELET - Abnormal; Notable for the following:    Hemoglobin 8.0 (*)    HCT 27.2 (*)    MCV 66.8 (*)    MCH 19.7 (*)    MCHC 29.4  (*)    RDW 21.9 (*)    Platelets 617 (*)    All other components within normal limits  BASIC METABOLIC PANEL - Abnormal; Notable for the following:    Glucose, Bld 100 (*)    All other components within normal limits  I-STAT BETA HCG BLOOD, ED ([facility], [facility], [facility] ONLY)  SAMPLE TO BLOOD BANK    Imaging Review No results found. I have personally reviewed and evaluated these images and lab results as part of my medical decision-making.   EKG Interpretation None      MDM   Final diagnoses:  Anemia, unspecified anemia type  Vaginal bleeding   Patient emergency department wanting to check hemoglobin. History of anemia, last admission hemoglobin down to a 4. Received blood transfusion. Currently taking iron supplements and has an appointment with GYN doctor next week. She did have uterine ablation in the past but states she continues to bleed. Will check blood work. Vital signs are normal. Patient is in no acute distress.   6:57 PM Patient's hemoglobin is 8 which appears to be at her baseline. I did review care everywhere notes as well. Her discharge hemoglobin after transfusions last time was 7.8. At this time, normal vital signs, she is in no acute distress, bleeding is normal for her and is not heavier than usual. I discussed with Dr. Oleta Mouse, we will start her on Megace, she has an appointment with her OB/GYN next week, she will follow up then. Return precautions discussed.   Filed Vitals:   05/09/16 1730 05/09/16 1800 05/09/16 1838 05/09/16 1845  BP:   132/91 138/95  Pulse: 84 95 82 86  Resp: 20 25 19 20   SpO2: 100% 100% 100% 100%    Jeannett Senior, PA-C 05/09/16 1858  Forde Dandy, MD 05/10/16 0041

## 2016-05-10 LAB — SAMPLE TO BLOOD BANK

## 2016-06-02 ENCOUNTER — Ambulatory Visit (HOSPITAL_COMMUNITY)
Admission: EM | Admit: 2016-06-02 | Discharge: 2016-06-02 | Disposition: A | Discharge status: Home / Self Care | Payer: Medicaid Other | Attending: Family Medicine | Admitting: Family Medicine

## 2016-06-02 ENCOUNTER — Encounter (HOSPITAL_COMMUNITY): Payer: Self-pay | Admitting: Emergency Medicine

## 2016-06-02 DIAGNOSIS — N898 Other specified noninflammatory disorders of vagina: Secondary | ICD-10-CM | POA: Diagnosis present

## 2016-06-02 DIAGNOSIS — B373 Candidiasis of vulva and vagina: Secondary | ICD-10-CM | POA: Diagnosis not present

## 2016-06-02 DIAGNOSIS — F17200 Nicotine dependence, unspecified, uncomplicated: Secondary | ICD-10-CM | POA: Insufficient documentation

## 2016-06-02 DIAGNOSIS — N390 Urinary tract infection, site not specified: Secondary | ICD-10-CM | POA: Diagnosis not present

## 2016-06-02 MED ORDER — FLUCONAZOLE 150 MG PO TABS: 150.0000 mg | ORAL_TABLET | Freq: Every day | ORAL | Status: DC

## 2016-06-02 MED ORDER — PHENAZOPYRIDINE HCL 200 MG PO TABS: 200.0000 mg | ORAL_TABLET | Freq: Three times a day (TID) | ORAL | Status: DC

## 2016-06-02 MED ORDER — CIPROFLOXACIN HCL 500 MG PO TABS: 500.0000 mg | ORAL_TABLET | Freq: Two times a day (BID) | ORAL | Status: DC

## 2016-06-02 NOTE — ED Provider Notes (Signed)
CSN: [id number]     Arrival date & time 06/02/16  [id number] History   First MD Initiated Contact with Patient 06/02/16 1038     Chief Complaint  Patient presents with  . Vaginal Discharge  . Vaginal Pain   (Consider location/radiation/quality/duration/timing/severity/associated sxs/prior Treatment) HPI Comments: Patient c/o dysuria, white vaginal DC, and vaginal itching.  Patient is a 35 y.o. female presenting with vaginal discharge, vaginal pain, and dysuria. The history is provided by the patient.  Vaginal Discharge Quality:  White Severity:  Moderate Onset quality:  Sudden Duration:  2 days Timing:  Intermittent Progression:  Worsening Chronicity:  New Associated symptoms: dysuria and vaginal itching   Risk factors: unprotected sex   Vaginal Pain  Dysuria Pain quality:  Burning Pain severity:  Moderate Onset quality:  Sudden Relieved by:  Nothing Worsened by:  Nothing tried Ineffective treatments:  None tried Associated symptoms: vaginal discharge   Risk factors: sexually active     Past Medical History  Diagnosis Date  . Hypertension   . Anemia    Past Surgical History  Procedure Laterality Date  . Endometrial ablation w/ novasure     History reviewed. No pertinent family history. Social History  Substance Use Topics  . Smoking status: Current Every Day Smoker  . Smokeless tobacco: None  . Alcohol Use: Yes     Comment: occasional   OB History    No data available     Review of Systems  Constitutional: Negative.   HENT: Negative.   Eyes: Negative.   Respiratory: Negative.   Cardiovascular: Negative.   Gastrointestinal: Negative.   Endocrine: Negative.   Genitourinary: Positive for dysuria, vaginal discharge and vaginal pain.  Musculoskeletal: Negative.   Skin: Negative.   Allergic/Immunologic: Negative.   Neurological: Negative.   Hematological: Negative.   Psychiatric/Behavioral: Negative.     Allergies  Review of patient's allergies indicates  no known allergies.  Home Medications   Prior to Admission medications   Medication Sig Start Date End Date Taking? Authorizing Provider  norgestimate-ethinyl estradiol (ORTHO-CYCLEN,SPRINTEC,PREVIFEM) 0.25-35 MG-MCG tablet Take 1 tablet by mouth daily. 02/08/16  Yes Smiley Houseman, MD  amLODipine (NORVASC) 5 MG tablet Take 5 mg by mouth daily.    Historical Provider, MD  ibuprofen (ADVIL,MOTRIN) 200 MG tablet Take 800 mg by mouth every 6 (six) hours as needed for headache or moderate pain.    Historical Provider, MD  megestrol (MEGACE) 40 MG tablet Take 40 mg 3 times a day until bleeding subsides then once daily until you see your OB/GYN. 05/09/16   Tatyana Kirichenko, PA-C   Meds Ordered and Administered this Visit  Medications - No data to display  BP 131/82 mmHg  Pulse 93  Temp(Src) 99.1 F (37.3 C) (Oral)  Resp 18  SpO2 100%  LMP 05/07/2016 (Exact Date) No data found.   Physical Exam  Constitutional: She appears well-developed and well-nourished.  HENT:  Head: Normocephalic and atraumatic.  Eyes: Conjunctivae are normal. Pupils are equal, round, and reactive to light.  Cardiovascular: Normal rate, regular rhythm and normal heart sounds.   Pulmonary/Chest: Effort normal and breath sounds normal.  Abdominal: Soft. Bowel sounds are normal.  Genitourinary: Vaginal discharge found.  BUS wnl Scant vaginal DC that is white Cervix w/o DC and not CMT and no adnexa tenderness Bilateral.    ED Course  Procedures (including critical care time)  Labs Review Labs Reviewed - No data to display  Imaging Review No results found.   Visual Acuity Review  Right Eye Distance:   Left Eye Distance:   Bilateral Distance:    Right Eye Near:   Left Eye Near:    Bilateral Near:         MDM  Vaginal DC Vulvovaginal Candidiasis Diflucan 150mg  one po now and repeat in one week #2 Endocervical cx GC chlamydia and wet prep  UTI Dysuria Dipstick UA did not cross over but  resulted with 3plus Leukocytes and pos nitrate Cipro 500mg  one po bid x 10 days #20 Pyridium 200mg  one po tid x 2days #6 UA cx    Lysbeth Penner, FNP 06/02/16 [place], FNP 06/02/16 1106

## 2016-06-02 NOTE — Discharge Instructions (Signed)

## 2016-06-02 NOTE — ED Notes (Signed)
Urine levels exceeded limits of Clinitex machine, reported to NP [employer].

## 2016-06-02 NOTE — ED Notes (Signed)
The patient presented to the [facility] with a complaint of vaginal discharge and pain with itching for 2 days.

## 2016-06-03 LAB — CERVICOVAGINAL ANCILLARY ONLY
Chlamydia: NEGATIVE
Neisseria Gonorrhea: NEGATIVE

## 2016-06-04 LAB — CERVICOVAGINAL ANCILLARY ONLY: Wet Prep (BD Affirm): POSITIVE — AB

## 2016-06-04 LAB — URINE CULTURE: Culture: >=100000 — AB

## 2016-06-07 ENCOUNTER — Telehealth (HOSPITAL_COMMUNITY): Payer: Self-pay | Admitting: Emergency Medicine

## 2016-06-07 NOTE — Telephone Encounter (Signed)
Called pt and notified of recent lab results from visit 7/12 Pt ID'd properly... Reports feeling better and sx have subsided and vag d/c has subsided Still taking Cipro and tolerating well.  Adv pt if sx are not getting better to return  Pt verb understanding Education on safe sex given  Per Dr. Valere Dross,   Notes Recorded by Sherlene Shams, MD on 06/05/2016 at 10:57 AM Clinical staff, please let patient know that urine culture was positive for E coli sensitive to cipro prescribed at [facility] visit 06/02/16. Finish cipro. Test for bacterial vaginosis (gardnerella) was also positive.  Visit note documents vag discharge/irritation, so would send rx flagyl 500mg  bid x 7d, #14 no refills.  Recheck as needed if symptoms persist. LM Notes Recorded by Sherlene Shams, MD on 06/03/2016 at 10:41 PM rx cipro given at [facility] visit 06/02/16. LM   Also, negative for Gon/Chlam

## 2016-12-21 ENCOUNTER — Encounter (HOSPITAL_COMMUNITY): Payer: Self-pay | Admitting: Emergency Medicine

## 2016-12-21 ENCOUNTER — Ambulatory Visit (HOSPITAL_COMMUNITY)
Admission: EM | Admit: 2016-12-21 | Discharge: 2016-12-21 | Disposition: A | Discharge status: Home / Self Care | Payer: Medicaid Other | Attending: Family Medicine | Admitting: Family Medicine

## 2016-12-21 DIAGNOSIS — D500 Iron deficiency anemia secondary to blood loss (chronic): Secondary | ICD-10-CM | POA: Diagnosis not present

## 2016-12-21 LAB — POCT I-STAT, CHEM 8
BUN: 6 mg/dL (ref 6–20)
CHLORIDE: 107 mmol/L (ref 101–111)
Calcium, Ion: 1.24 mmol/L (ref 1.15–1.40)
Creatinine, Ser: 0.7 mg/dL (ref 0.44–1.00)
Glucose, Bld: 101 mg/dL — ABNORMAL HIGH (ref 65–99)
HEMATOCRIT: 27 % — AB (ref 36.0–46.0)
Hemoglobin: 9.2 g/dL — ABNORMAL LOW (ref 12.0–15.0)
POTASSIUM: 4.2 mmol/L (ref 3.5–5.1)
SODIUM: 139 mmol/L (ref 135–145)
TCO2: 24 mmol/L (ref 0–100)

## 2016-12-21 NOTE — ED Provider Notes (Signed)
[place]    CSN: [id number] Arrival date & time: 12/21/16  [place]     History   Chief Complaint Chief Complaint  Patient presents with  . Dizziness    HPI Cynthia Horn is a 36 y.o. female.   The history is provided by the patient.  Dizziness  Quality:  Lightheadedness Severity:  Mild Onset quality:  Gradual Duration:  3 days Timing:  Intermittent Progression:  Unchanged Chronicity:  New Context: standing up   Context: not with ear pain, not with eye movement, not with loss of consciousness and not with medication   Associated symptoms: tinnitus   Associated symptoms: no chest pain, no palpitations and no syncope   Risk factors: anemia     Past Medical History:  Diagnosis Date  . Anemia   . Hypertension     Patient Active Problem List   Diagnosis Date Noted  . Symptomatic anemia 02/07/2016    Past Surgical History:  Procedure Laterality Date  . ENDOMETRIAL ABLATION W/ NOVASURE      OB History    No data available       Home Medications    Prior to Admission medications   Medication Sig Start Date End Date Taking? Authorizing Provider  ibuprofen (ADVIL,MOTRIN) 200 MG tablet Take 800 mg by mouth every 6 (six) hours as needed for headache or moderate pain.   Yes Historical Provider, MD  norgestimate-ethinyl estradiol (ORTHO-CYCLEN,SPRINTEC,PREVIFEM) 0.25-35 MG-MCG tablet Take 1 tablet by mouth daily. 02/08/16  Yes Smiley Houseman, MD  amLODipine (NORVASC) 5 MG tablet Take 5 mg by mouth daily.    Historical Provider, MD  ciprofloxacin (CIPRO) 500 MG tablet Take 1 tablet (500 mg total) by mouth 2 (two) times daily. 06/02/16   Lysbeth Penner, FNP  fluconazole (DIFLUCAN) 150 MG tablet Take 1 tablet (150 mg total) by mouth daily. 06/02/16   Lysbeth Penner, FNP  megestrol (MEGACE) 40 MG tablet Take 40 mg 3 times a day until bleeding subsides then once daily until you see your OB/GYN. 05/09/16   Tatyana Kirichenko, PA-C  phenazopyridine  (PYRIDIUM) 200 MG tablet Take 1 tablet (200 mg total) by mouth 3 (three) times daily. 06/02/16   Lysbeth Penner, FNP    Family History History reviewed. No pertinent family history.  Social History Social History  Substance Use Topics  . Smoking status: Current Every Day Smoker    Packs/day: 0.25    Types: Cigarettes  . Smokeless tobacco: Never Used  . Alcohol use Yes     Comment: occasional     Allergies   Patient has no known allergies.   Review of Systems Review of Systems  Constitutional: Negative.  Negative for fever.  HENT: Positive for tinnitus.   Cardiovascular: Negative.  Negative for chest pain, palpitations, leg swelling and syncope.  Gastrointestinal: Negative.   Genitourinary: Positive for menstrual problem.  Neurological: Positive for dizziness.  All other systems reviewed and are negative.    Physical Exam Triage Vital Signs ED Triage Vitals [12/21/16 1725]  Enc Vitals Group     BP 128/69     Pulse Rate 99     Resp      Temp 98.1 F (36.7 C)     Temp Source Oral     SpO2 100 %     Weight      Height      Head Circumference      Peak Flow      Pain Score  Pain Loc      Pain Edu?      Excl. in [place]?    No data found.   Updated Vital Signs BP 128/69 (BP Location: Right Arm)   Pulse 99   Temp 98.1 F (36.7 C) (Oral)   LMP 12/19/2016 (Exact Date)   SpO2 100%   Visual Acuity Right Eye Distance:   Left Eye Distance:   Bilateral Distance:    Right Eye Near:   Left Eye Near:    Bilateral Near:     Physical Exam  Constitutional: She appears well-developed and well-nourished.  HENT:  Head: Normocephalic.  Right Ear: External ear normal.  Left Ear: External ear normal.  Nose: Nose normal.  Mouth/Throat: Oropharynx is clear and moist.  Eyes: EOM are normal. Pupils are equal, round, and reactive to light.  Neck: Normal range of motion. Neck supple.  Cardiovascular: Regular rhythm, normal heart sounds, intact distal pulses and  normal pulses.  Tachycardia present.   Pulmonary/Chest: Effort normal and breath sounds normal.  Lymphadenopathy:    She has no cervical adenopathy.  Skin: Skin is warm and dry.  Nursing note and vitals reviewed.    UC Treatments / Results  Labs (all labs ordered are listed, but only abnormal results are displayed) Labs Reviewed - No data to display  EKG  EKG Interpretation None       Radiology No results found.  Procedures Procedures (including critical care time)  Medications Ordered in UC Medications - No data to display   Initial Impression / Assessment and Plan / UC Course  I have reviewed the triage vital signs and the nursing notes.  Pertinent labs & imaging results that were available during my care of the patient were reviewed by me and considered in my medical decision making (see chart for details).       Final Clinical Impressions(s) / UC Diagnoses   Final diagnoses:  None    New Prescriptions New Prescriptions   No medications on file     Billy Fischer, MD 12/21/16 [phone]

## 2016-12-21 NOTE — ED Triage Notes (Signed)
Pt states she has been feeling dizzy for 2-3 days.  She cannot associate it with any event.  She denies a fever at home, but reports chills.

## 2016-12-21 NOTE — Discharge Instructions (Signed)
Your hemoglobin is 9.2 take your iron daily, discuss further care with your doctor

## 2017-02-09 ENCOUNTER — Emergency Department (HOSPITAL_COMMUNITY)
Admission: EM | Admit: 2017-02-09 | Discharge: 2017-02-09 | Disposition: A | Discharge status: Home / Self Care | Payer: Medicaid Other | Attending: Emergency Medicine | Admitting: Emergency Medicine

## 2017-02-09 ENCOUNTER — Encounter (HOSPITAL_COMMUNITY): Payer: Self-pay

## 2017-02-09 ENCOUNTER — Emergency Department (HOSPITAL_COMMUNITY): Payer: Medicaid Other

## 2017-02-09 DIAGNOSIS — I10 Essential (primary) hypertension: Secondary | ICD-10-CM | POA: Diagnosis not present

## 2017-02-09 DIAGNOSIS — F17210 Nicotine dependence, cigarettes, uncomplicated: Secondary | ICD-10-CM | POA: Insufficient documentation

## 2017-02-09 DIAGNOSIS — N939 Abnormal uterine and vaginal bleeding, unspecified: Secondary | ICD-10-CM | POA: Diagnosis present

## 2017-02-09 DIAGNOSIS — R102 Pelvic and perineal pain: Secondary | ICD-10-CM

## 2017-02-09 DIAGNOSIS — A5901 Trichomonal vulvovaginitis: Secondary | ICD-10-CM | POA: Diagnosis not present

## 2017-02-09 DIAGNOSIS — N938 Other specified abnormal uterine and vaginal bleeding: Secondary | ICD-10-CM | POA: Diagnosis not present

## 2017-02-09 LAB — URINALYSIS, ROUTINE W REFLEX MICROSCOPIC
Bilirubin Urine: NEGATIVE
GLUCOSE, UA: NEGATIVE mg/dL
Ketones, ur: NEGATIVE mg/dL
Leukocytes, UA: NEGATIVE
Nitrite: NEGATIVE
PH: 8 (ref 5.0–8.0)
PROTEIN: 100 mg/dL — AB
Specific Gravity, Urine: 1.017 (ref 1.005–1.030)

## 2017-02-09 LAB — WET PREP, GENITAL
CLUE CELLS WET PREP: NONE SEEN
Sperm: NONE SEEN
Yeast Wet Prep HPF POC: NONE SEEN

## 2017-02-09 LAB — POC URINE PREG, ED: Preg Test, Ur: NEGATIVE

## 2017-02-09 MED ORDER — METRONIDAZOLE 500 MG PO TABS
2000.0000 mg | ORAL_TABLET | Freq: Once | ORAL | Status: AC
  Administered 2017-02-09: 2000 mg via ORAL
  Filled 2017-02-09: qty 4

## 2017-02-09 MED ORDER — KETOROLAC TROMETHAMINE 60 MG/2ML IM SOLN
60.0000 mg | Freq: Once | INTRAMUSCULAR | Status: AC
  Administered 2017-02-09: 60 mg via INTRAMUSCULAR
  Filled 2017-02-09: qty 2

## 2017-02-09 MED ORDER — DOXYCYCLINE HYCLATE 100 MG PO CAPS: 100.0000 mg | ORAL_CAPSULE | Freq: Two times a day (BID) | ORAL | 0 refills | Status: DC

## 2017-02-09 MED ORDER — STERILE WATER FOR INJECTION IJ SOLN
INTRAMUSCULAR | Status: AC
  Administered 2017-02-09: 10 mL
  Filled 2017-02-09: qty 10

## 2017-02-09 MED ORDER — CEFTRIAXONE SODIUM 250 MG IJ SOLR
250.0000 mg | Freq: Once | INTRAMUSCULAR | Status: AC
  Administered 2017-02-09: 250 mg via INTRAMUSCULAR
  Filled 2017-02-09: qty 250

## 2017-02-09 MED ORDER — ONDANSETRON 4 MG PO TBDP
4.0000 mg | ORAL_TABLET | Freq: Once | ORAL | Status: AC
  Administered 2017-02-09: 4 mg via ORAL
  Filled 2017-02-09: qty 1

## 2017-02-09 NOTE — ED Triage Notes (Signed)
Pt c/o intermittent nausea x 1 week and intermittent "dark brown" vaginal bleeding and lower abdominal pain x 2 weeks.  Pain score 7/10.  Pt reports that she finished her normal menstrual cycle around 3/10.  Denies dysuria and vaginal discharge.

## 2017-02-09 NOTE — ED Provider Notes (Signed)
[facility] DEPT Provider Note   CSN: [id number] Arrival date & time: 02/09/17  1240     History   Chief Complaint Chief Complaint  Patient presents with  . Abnormal Vaginal Bleeding  . Abdominal Pain  . Nausea    HPI Cynthia Horn is a 36 y.o. female.  Patient is a 36 year old female with history of heavy vaginal bleeding was currently on OCPs states she had her last menstrual period ended March 10 but since that time she's had intermittent dark bloody discharge. Over the last 3 days discharge has seemed to get worse. Also having pain in the left pelvic area that has gotten worse over the last 3 days. Also having some mild pain in the lower back. She denies any other vaginal discharge, urinary complaints. Only one sexual partner for loss G or she does not use protection. She denies any fever, nausea, vomiting.   The history is provided by the patient.  Abdominal Pain   This is a new problem. The current episode started 2 days ago. The problem occurs constantly. The problem has been gradually worsening. Associated with: vaginal bleeding. Pain location: left pelvic region. The quality of the pain is cramping, colicky and shooting. The pain is at a severity of 7/10. The pain is moderate. Pertinent negatives include anorexia, fever, nausea, vomiting, constipation, dysuria and frequency. Nothing aggravates the symptoms. Nothing relieves the symptoms. Past medical history comments: hx of heavy menses and currently taking OCP's .    Past Medical History:  Diagnosis Date  . Anemia   . Hypertension     Patient Active Problem List   Diagnosis Date Noted  . Symptomatic anemia 02/07/2016    Past Surgical History:  Procedure Laterality Date  . ENDOMETRIAL ABLATION W/ NOVASURE      OB History    No data available       Home Medications    Prior to Admission medications   Medication Sig Start Date End Date Taking? Authorizing Provider  amLODipine (NORVASC) 5 MG tablet Take 5  mg by mouth daily.    Historical Provider, MD  ciprofloxacin (CIPRO) 500 MG tablet Take 1 tablet (500 mg total) by mouth 2 (two) times daily. 06/02/16   Lysbeth Penner, FNP  fluconazole (DIFLUCAN) 150 MG tablet Take 1 tablet (150 mg total) by mouth daily. 06/02/16   Lysbeth Penner, FNP  ibuprofen (ADVIL,MOTRIN) 200 MG tablet Take 800 mg by mouth every 6 (six) hours as needed for headache or moderate pain.    Historical Provider, MD  megestrol (MEGACE) 40 MG tablet Take 40 mg 3 times a day until bleeding subsides then once daily until you see your OB/GYN. 05/09/16   Tatyana Kirichenko, PA-C  norgestimate-ethinyl estradiol (ORTHO-CYCLEN,SPRINTEC,PREVIFEM) 0.25-35 MG-MCG tablet Take 1 tablet by mouth daily. 02/08/16   Smiley Houseman, MD  phenazopyridine (PYRIDIUM) 200 MG tablet Take 1 tablet (200 mg total) by mouth 3 (three) times daily. 06/02/16   Lysbeth Penner, FNP    Family History History reviewed. No pertinent family history.  Social History Social History  Substance Use Topics  . Smoking status: Current Every Day Smoker    Packs/day: 0.25    Types: Cigarettes  . Smokeless tobacco: Never Used  . Alcohol use Yes     Comment: occasional     Allergies   Patient has no known allergies.   Review of Systems Review of Systems  Constitutional: Negative for fever.  Gastrointestinal: Positive for abdominal pain. Negative for anorexia, constipation,  nausea and vomiting.  Genitourinary: Negative for dysuria and frequency.  All other systems reviewed and are negative.    Physical Exam Updated Vital Signs BP (!) 141/101 (BP Location: Right Arm)   Pulse 99   Temp 98.4 F (36.9 C) (Oral)   Resp 16   Ht 4\' 11"  (1.499 m)   LMP 01/29/2017   SpO2 100%   Physical Exam  Constitutional: She is oriented to person, place, and time. She appears well-developed and well-nourished. No distress.  HENT:  Head: Normocephalic and atraumatic.  Mouth/Throat: Oropharynx is clear and moist.    Eyes: Conjunctivae and EOM are normal. Pupils are equal, round, and reactive to light.  Neck: Normal range of motion. Neck supple.  Cardiovascular: Normal rate, regular rhythm and intact distal pulses.   No murmur heard. Pulmonary/Chest: Effort normal and breath sounds normal. No respiratory distress. She has no wheezes. She has no rales.  Abdominal: Soft. She exhibits no distension. There is no tenderness. There is no rebound and no guarding.    Left-sided pelvic tenderness   Genitourinary: Uterus normal. Cervix exhibits no motion tenderness, no discharge and no friability. Right adnexum displays no mass, no tenderness and no fullness. Left adnexum displays tenderness. Left adnexum displays no mass. There is bleeding in the vagina.  Musculoskeletal: Normal range of motion. She exhibits no edema or tenderness.  Neurological: She is alert and oriented to person, place, and time.  Skin: Skin is warm and dry. No rash noted. No erythema.  Psychiatric: She has a normal mood and affect. Her behavior is normal.  Nursing note and vitals reviewed.    ED Treatments / Results  Labs (all labs ordered are listed, but only abnormal results are displayed) Labs Reviewed  WET PREP, GENITAL - Abnormal; Notable for the following:       Result Value   Trich, Wet Prep PRESENT (*)    WBC, Wet Prep HPF POC MANY (*)    All other components within normal limits  URINALYSIS, ROUTINE W REFLEX MICROSCOPIC - Abnormal; Notable for the following:    Color, Urine RED (*)    APPearance CLOUDY (*)    Hgb urine dipstick LARGE (*)    Protein, ur 100 (*)    Bacteria, UA MANY (*)    Squamous Epithelial / LPF 0-5 (*)    All other components within normal limits  POC URINE PREG, ED  GC/CHLAMYDIA PROBE AMP ([place]) NOT AT [facility]    EKG  EKG Interpretation None       Radiology US Transvaginal Non-ob  Result Date: 02/09/2017 CLINICAL DATA:  Pelvic pain EXAM: TRANSABDOMINAL AND TRANSVAGINAL ULTRASOUND OF  PELVIS DOPPLER ULTRASOUND OF OVARIES TECHNIQUE: Both transabdominal and transvaginal ultrasound examinations of the pelvis were performed. Transabdominal technique was performed for global imaging of the pelvis including uterus, ovaries, adnexal regions, and pelvic cul-de-sac. It was necessary to proceed with endovaginal exam following the transabdominal exam to visualize the endometrium and ovaries. Color and duplex Doppler ultrasound was utilized to evaluate blood flow to the ovaries. COMPARISON:  None. FINDINGS: Uterus Measurements: 9.8 x 7.2 x 8.4 cm. Heterogenous echotexture. Multiple myometrial masses, consistent with fibroids. The right posterior fibroid measures 4.3 x 3.5 x 4.5 cm. Exophytic posterior fundal fibroid measures 2.3 x 3.5 x 3 cm. Left anterior myometrial fibroid measures 3 x 2.8 x 3.2 cm. Small fundal fibroid measures 2.9 x 1.3 x 2.5 cm. Endometrium Thickness: 4.3 mm. Complex fluid is present within the endometrial canal. Right ovary Measurements:  3.3 x 1.2 x 2.6 cm. Normal appearance/no adnexal mass. Left ovary Measurements: 2.7 x 1.7 x 1.6 cm. Normal appearance/no adnexal mass. Pulsed Doppler evaluation of both ovaries demonstrates normal low-resistance arterial and venous waveforms. Other findings No abnormal free fluid. IMPRESSION: 1. No sonographic evidence for ovarian torsion 2. Heterogenous uterus containing multiple fibroids. 3. Small to moderate complex fluid present in the endometrial canal, may represent blood products or possible infected fluid. Electronically Signed   By: Donavan Foil M.D.   On: 02/09/2017 17:02   US Pelvis Complete  Result Date: 02/09/2017 CLINICAL DATA:  Pelvic pain EXAM: TRANSABDOMINAL AND TRANSVAGINAL ULTRASOUND OF PELVIS DOPPLER ULTRASOUND OF OVARIES TECHNIQUE: Both transabdominal and transvaginal ultrasound examinations of the pelvis were performed. Transabdominal technique was performed for global imaging of the pelvis including uterus, ovaries, adnexal  regions, and pelvic cul-de-sac. It was necessary to proceed with endovaginal exam following the transabdominal exam to visualize the endometrium and ovaries. Color and duplex Doppler ultrasound was utilized to evaluate blood flow to the ovaries. COMPARISON:  None. FINDINGS: Uterus Measurements: 9.8 x 7.2 x 8.4 cm. Heterogenous echotexture. Multiple myometrial masses, consistent with fibroids. The right posterior fibroid measures 4.3 x 3.5 x 4.5 cm. Exophytic posterior fundal fibroid measures 2.3 x 3.5 x 3 cm. Left anterior myometrial fibroid measures 3 x 2.8 x 3.2 cm. Small fundal fibroid measures 2.9 x 1.3 x 2.5 cm. Endometrium Thickness: 4.3 mm. Complex fluid is present within the endometrial canal. Right ovary Measurements: 3.3 x 1.2 x 2.6 cm. Normal appearance/no adnexal mass. Left ovary Measurements: 2.7 x 1.7 x 1.6 cm. Normal appearance/no adnexal mass. Pulsed Doppler evaluation of both ovaries demonstrates normal low-resistance arterial and venous waveforms. Other findings No abnormal free fluid. IMPRESSION: 1. No sonographic evidence for ovarian torsion 2. Heterogenous uterus containing multiple fibroids. 3. Small to moderate complex fluid present in the endometrial canal, may represent blood products or possible infected fluid. Electronically Signed   By: Donavan Foil M.D.   On: 02/09/2017 17:02   [place] Art/ven Flow Abd Pelv Doppler  Result Date: 02/09/2017 CLINICAL DATA:  Pelvic pain EXAM: TRANSABDOMINAL AND TRANSVAGINAL ULTRASOUND OF PELVIS DOPPLER ULTRASOUND OF OVARIES TECHNIQUE: Both transabdominal and transvaginal ultrasound examinations of the pelvis were performed. Transabdominal technique was performed for global imaging of the pelvis including uterus, ovaries, adnexal regions, and pelvic cul-de-sac. It was necessary to proceed with endovaginal exam following the transabdominal exam to visualize the endometrium and ovaries. Color and duplex Doppler ultrasound was utilized to evaluate blood flow to  the ovaries. COMPARISON:  None. FINDINGS: Uterus Measurements: 9.8 x 7.2 x 8.4 cm. Heterogenous echotexture. Multiple myometrial masses, consistent with fibroids. The right posterior fibroid measures 4.3 x 3.5 x 4.5 cm. Exophytic posterior fundal fibroid measures 2.3 x 3.5 x 3 cm. Left anterior myometrial fibroid measures 3 x 2.8 x 3.2 cm. Small fundal fibroid measures 2.9 x 1.3 x 2.5 cm. Endometrium Thickness: 4.3 mm. Complex fluid is present within the endometrial canal. Right ovary Measurements: 3.3 x 1.2 x 2.6 cm. Normal appearance/no adnexal mass. Left ovary Measurements: 2.7 x 1.7 x 1.6 cm. Normal appearance/no adnexal mass. Pulsed Doppler evaluation of both ovaries demonstrates normal low-resistance arterial and venous waveforms. Other findings No abnormal free fluid. IMPRESSION: 1. No sonographic evidence for ovarian torsion 2. Heterogenous uterus containing multiple fibroids. 3. Small to moderate complex fluid present in the endometrial canal, may represent blood products or possible infected fluid. Electronically Signed   By: Donavan Foil M.D.   On:  02/09/2017 17:02    Procedures Procedures (including critical care time)  Medications Ordered in ED Medications - No data to display   Initial Impression / Assessment and Plan / ED Course  I have reviewed the triage vital signs and the nursing notes.  Pertinent labs & imaging results that were available during my care of the patient were reviewed by me and considered in my medical decision making (see chart for details).     Patient with history of heavy menstrual cycles is currently on OCPs presenting with persistent spotting and bleeding now with 3 days of left pelvic tenderness. Low suspicion that patient has sexually transmitted infection as she is sexually active with only 1 partner and has had no discharge. She denies any fever or infectious symptoms. She denies any urinary symptoms. On exam she does have left pelvic tenderness with  concern for possible. Cyst versus torsion. Lower suspicion for TOA. Will do ultrasound to further identify. GC and Chlamydia sent. Patient does not have any symptoms suggestive of anemia like lightheadedness, shortness of breath, palpitations or chest pain. Given Toradol for pain. Urine pregnancy test was negative.  5:06 PM UA shows findings concerning for possible UTI however nitrite and leukocyte positive and no urinary tract symptoms. However patient is positive for trichomonas with ultrasound negative for TOA or poor vision but does have fluid in the endometrial canal however patient is bleeding so is most likely blood. Will cover patient with Rocephin, doxycycline and given Flagyl for trichomonas.  Final Clinical Impressions(s) / ED Diagnoses   Final diagnoses:  Pelvic pain  Trichomonas vaginitis  DUB (dysfunctional uterine bleeding)    New Prescriptions New Prescriptions   DOXYCYCLINE (VIBRAMYCIN) 100 MG CAPSULE    Take 1 capsule (100 mg total) by mouth 2 (two) times daily.     Blanchie Dessert, MD 02/09/17 1733

## 2017-02-10 LAB — GC/CHLAMYDIA PROBE AMP (~~LOC~~) NOT AT ARMC
Chlamydia: NEGATIVE
NEISSERIA GONORRHEA: NEGATIVE

## 2017-02-23 ENCOUNTER — Encounter (HOSPITAL_COMMUNITY): Payer: Self-pay | Admitting: Emergency Medicine

## 2017-02-23 ENCOUNTER — Observation Stay (HOSPITAL_COMMUNITY)
Admission: EM | Admit: 2017-02-23 | Discharge: 2017-02-24 | Disposition: A | Discharge status: Home / Self Care | Payer: Medicaid Other | Attending: Family Medicine | Admitting: Family Medicine

## 2017-02-23 DIAGNOSIS — D259 Leiomyoma of uterus, unspecified: Secondary | ICD-10-CM | POA: Diagnosis present

## 2017-02-23 DIAGNOSIS — D500 Iron deficiency anemia secondary to blood loss (chronic): Secondary | ICD-10-CM | POA: Diagnosis not present

## 2017-02-23 DIAGNOSIS — I10 Essential (primary) hypertension: Secondary | ICD-10-CM | POA: Insufficient documentation

## 2017-02-23 DIAGNOSIS — Z720 Tobacco use: Secondary | ICD-10-CM | POA: Diagnosis present

## 2017-02-23 DIAGNOSIS — F17210 Nicotine dependence, cigarettes, uncomplicated: Secondary | ICD-10-CM | POA: Diagnosis not present

## 2017-02-23 DIAGNOSIS — N939 Abnormal uterine and vaginal bleeding, unspecified: Secondary | ICD-10-CM | POA: Diagnosis present

## 2017-02-23 DIAGNOSIS — D649 Anemia, unspecified: Secondary | ICD-10-CM | POA: Diagnosis present

## 2017-02-23 DIAGNOSIS — Z79899 Other long term (current) drug therapy: Secondary | ICD-10-CM | POA: Diagnosis not present

## 2017-02-23 DIAGNOSIS — D696 Thrombocytopenia, unspecified: Secondary | ICD-10-CM | POA: Insufficient documentation

## 2017-02-23 DIAGNOSIS — R42 Dizziness and giddiness: Secondary | ICD-10-CM | POA: Diagnosis present

## 2017-02-23 HISTORY — DX: Abnormal uterine and vaginal bleeding, unspecified: N93.9

## 2017-02-23 HISTORY — DX: Tobacco use: Z72.0

## 2017-02-23 HISTORY — DX: Leiomyoma of uterus, unspecified: D25.9

## 2017-02-23 LAB — PROTIME-INR
INR: 0.98
Prothrombin Time: 13 seconds (ref 11.4–15.2)

## 2017-02-23 LAB — URINALYSIS, ROUTINE W REFLEX MICROSCOPIC
BILIRUBIN URINE: NEGATIVE
Bacteria, UA: NONE SEEN
Glucose, UA: NEGATIVE mg/dL
Ketones, ur: 20 mg/dL — AB
NITRITE: NEGATIVE
PH: 6 (ref 5.0–8.0)
Protein, ur: NEGATIVE mg/dL
SPECIFIC GRAVITY, URINE: 1.016 (ref 1.005–1.030)
SQUAMOUS EPITHELIAL / LPF: NONE SEEN

## 2017-02-23 LAB — CBC
HCT: 20.2 % — ABNORMAL LOW (ref 36.0–46.0)
HCT: 22.9 % — ABNORMAL LOW (ref 36.0–46.0)
Hemoglobin: 5.5 g/dL — CL (ref 12.0–15.0)
Hemoglobin: 6.4 g/dL — CL (ref 12.0–15.0)
MCH: 18.3 pg — ABNORMAL LOW (ref 26.0–34.0)
MCH: 19.5 pg — ABNORMAL LOW (ref 26.0–34.0)
MCHC: 27.2 g/dL — ABNORMAL LOW (ref 30.0–36.0)
MCHC: 27.9 g/dL — ABNORMAL LOW (ref 30.0–36.0)
MCV: 67.3 fL — AB (ref 78.0–100.0)
MCV: 69.8 fL — ABNORMAL LOW (ref 78.0–100.0)
PLATELETS: 105 10*3/uL — AB (ref 150–400)
Platelets: 104 10*3/uL — ABNORMAL LOW (ref 150–400)
RBC: 3 MIL/uL — ABNORMAL LOW (ref 3.87–5.11)
RBC: 3.28 MIL/uL — AB (ref 3.87–5.11)
RDW: 29.4 % — AB (ref 11.5–15.5)
RDW: 29.5 % — ABNORMAL HIGH (ref 11.5–15.5)
WBC: 9.8 10*3/uL (ref 4.0–10.5)
WBC: 13.8 10*3/uL — AB (ref 4.0–10.5)

## 2017-02-23 LAB — BASIC METABOLIC PANEL
Anion gap: 6 (ref 5–15)
BUN: 8 mg/dL (ref 6–20)
CALCIUM: 8.6 mg/dL — AB (ref 8.9–10.3)
CO2: 21 mmol/L — ABNORMAL LOW (ref 22–32)
CREATININE: 0.63 mg/dL (ref 0.44–1.00)
Chloride: 110 mmol/L (ref 101–111)
GFR calc Af Amer: >60 mL/min (ref >60)
Glucose, Bld: 93 mg/dL (ref 65–99)
Potassium: 3.5 mmol/L (ref 3.5–5.1)
SODIUM: 137 mmol/L (ref 135–145)

## 2017-02-23 LAB — WET PREP, GENITAL
CLUE CELLS WET PREP: NONE SEEN
Sperm: NONE SEEN
Trich, Wet Prep: NONE SEEN
YEAST WET PREP: NONE SEEN

## 2017-02-23 LAB — I-STAT BETA HCG BLOOD, ED (MC, WL, AP ONLY): I-stat hCG, quantitative: <5 m[IU]/mL (ref <5)

## 2017-02-23 LAB — PREPARE RBC (CROSSMATCH)

## 2017-02-23 LAB — APTT: APTT: 25 s (ref 24–36)

## 2017-02-23 MED ORDER — ACETAMINOPHEN 650 MG RE SUPP: 650.0000 mg | Freq: Four times a day (QID) | RECTAL | Status: DC | PRN

## 2017-02-23 MED ORDER — POLYETHYLENE GLYCOL 3350 17 G PO PACK: 17.0000 g | PACK | Freq: Every day | ORAL | Status: DC | PRN

## 2017-02-23 MED ORDER — ACETAMINOPHEN 325 MG PO TABS: 650.0000 mg | ORAL_TABLET | Freq: Four times a day (QID) | ORAL | Status: DC | PRN

## 2017-02-23 MED ORDER — HYDRALAZINE HCL 20 MG/ML IJ SOLN: 5.0000 mg | INTRAMUSCULAR | Status: DC | PRN

## 2017-02-23 MED ORDER — SODIUM CHLORIDE 0.9 % IV SOLN
Freq: Once | INTRAVENOUS | Status: AC
  Administered 2017-02-23: 20:00:00 via INTRAVENOUS

## 2017-02-23 MED ORDER — SODIUM CHLORIDE 0.9 % IV BOLUS (SEPSIS)
1000.0000 mL | Freq: Once | INTRAVENOUS | Status: AC
Start: 2017-02-23 — End: 2017-02-23
  Administered 2017-02-23: 1000 mL via INTRAVENOUS

## 2017-02-23 MED ORDER — AMLODIPINE BESYLATE 5 MG PO TABS
5.0000 mg | ORAL_TABLET | Freq: Every day | ORAL | Status: DC
  Administered 2017-02-23 – 2017-02-24 (×2): 5 mg via ORAL
  Filled 2017-02-23 (×2): qty 1

## 2017-02-23 MED ORDER — SODIUM CHLORIDE 0.9% FLUSH: 3.0000 mL | Freq: Two times a day (BID) | INTRAVENOUS | Status: DC

## 2017-02-23 MED ORDER — NICOTINE 21 MG/24HR TD PT24
21.0000 mg | MEDICATED_PATCH | Freq: Every day | TRANSDERMAL | Status: DC
  Administered 2017-02-23 – 2017-02-24 (×2): 21 mg via TRANSDERMAL
  Filled 2017-02-23 (×2): qty 1

## 2017-02-23 MED ORDER — MEGESTROL ACETATE 40 MG PO TABS
40.0000 mg | ORAL_TABLET | Freq: Two times a day (BID) | ORAL | Status: DC
  Administered 2017-02-24: 40 mg via ORAL
  Filled 2017-02-23 (×2): qty 1

## 2017-02-23 MED ORDER — FERROUS GLUCONATE 324 (38 FE) MG PO TABS
324.0000 mg | ORAL_TABLET | Freq: Two times a day (BID) | ORAL | Status: DC
  Administered 2017-02-24: 324 mg via ORAL
  Filled 2017-02-23: qty 1

## 2017-02-23 MED ORDER — ZOLPIDEM TARTRATE 5 MG PO TABS: 5.0000 mg | ORAL_TABLET | Freq: Every evening | ORAL | Status: DC | PRN

## 2017-02-23 MED ORDER — SODIUM CHLORIDE 0.9 % IV SOLN
INTRAVENOUS | Status: DC
  Administered 2017-02-23 – 2017-02-24 (×2): via INTRAVENOUS

## 2017-02-23 MED ORDER — ONDANSETRON HCL 4 MG/2ML IJ SOLN: 4.0000 mg | Freq: Three times a day (TID) | INTRAMUSCULAR | Status: DC | PRN

## 2017-02-23 NOTE — ED Notes (Signed)
Attempted to give report 

## 2017-02-23 NOTE — ED Provider Notes (Signed)
[facility] DEPT Provider Note   CSN: [id number] Arrival date & time: 02/23/17  1619     History   Chief Complaint Chief Complaint  Patient presents with  . Vaginal Bleeding  . Fatigue    HPI Cynthia Horn is a 36 y.o. female.  HPI 36 year old female with history of chronic anemia secondary to uterine fibroids who presents with worsening vaginal bleeding. The patient states that she has been bleeding since the end of February. She had a normal period at that time, stopped bleeding for several days, but then has had progressively worsening dark red and intermittently brown vaginal bleeding. She describes intermittent abdominal cramping but this comes and goes and is not constant. Over the last several weeks, she hasa worsening lightheadedness, fatigue, and dyspnea on exertion. She's also been craving ice more than usual. She's been taking her iron supplements as prescribed but is no longer on oral contraceptives as she lost her primary care doctor. Denies any current chest pain. She does feel mildly short of breath with exertion but denies shortness of breath at rest. No blood thinner use.  Past Medical History:  Diagnosis Date  . Anemia   . Fibroid uterus   . Hypertension   . Tobacco abuse     Patient Active Problem List   Diagnosis Date Noted  . Vaginal bleeding 02/23/2017  . Fibroid uterus   . Hypertension   . Tobacco abuse   . Symptomatic anemia 02/07/2016    Past Surgical History:  Procedure Laterality Date  . ENDOMETRIAL ABLATION W/ NOVASURE      OB History    No data available       Home Medications    Prior to Admission medications   Medication Sig Start Date End Date Taking? Authorizing Provider  Ferrous Gluconate (IRON 27 PO) Take 1 tablet by mouth 2 (two) times daily.   Yes Historical Provider, MD  ibuprofen (ADVIL,MOTRIN) 200 MG tablet Take 800 mg by mouth every 6 (six) hours as needed for headache, moderate pain or cramping.    Yes Historical  Provider, MD  doxycycline (VIBRAMYCIN) 100 MG capsule Take 1 capsule (100 mg total) by mouth 2 (two) times daily. Patient not taking: Reported on 02/23/2017 02/09/17   Blanchie Dessert, MD  norgestimate-ethinyl estradiol (ORTHO-CYCLEN,SPRINTEC,PREVIFEM) 0.25-35 MG-MCG tablet Take 1 tablet by mouth daily. Patient not taking: Reported on 02/23/2017 02/08/16   Smiley Houseman, MD    Family History No family history on file.  Social History Social History  Substance Use Topics  . Smoking status: Current Every Day Smoker    Packs/day: 0.25    Types: Cigarettes  . Smokeless tobacco: Never Used  . Alcohol use Yes     Comment: occasional     Allergies   Patient has no known allergies.   Review of Systems Review of Systems  Constitutional: Positive for fatigue.  Respiratory: Positive for shortness of breath.   Gastrointestinal: Positive for abdominal pain.  Genitourinary: Positive for vaginal bleeding. Negative for vaginal discharge and vaginal pain.  Neurological: Positive for light-headedness.  All other systems reviewed and are negative.    Physical Exam Updated Vital Signs BP (!) 147/90 (BP Location: Left Arm)   Pulse 81   Temp 98.2 F (36.8 C) (Oral)   Resp 18   LMP 01/29/2017   SpO2 100%   Physical Exam  Constitutional: She is oriented to person, place, and time. She appears well-developed and well-nourished. No distress.  HENT:  Head: Normocephalic and atraumatic.  Conjunctival pallor bilaterally  Eyes: Conjunctivae are normal.  Neck: Neck supple.  Cardiovascular: Normal rate, regular rhythm and normal heart sounds.  Exam reveals no friction rub.   No murmur heard. Pulmonary/Chest: Effort normal and breath sounds normal. No respiratory distress. She has no wheezes. She has no rales.  Abdominal: Soft. She exhibits no distension. There is no tenderness.  Genitourinary:  Genitourinary Comments: Mild amount of dark red blood in vaginal vault without evidence of  active bleeding. No adnexal pain. No CMT.  Musculoskeletal: She exhibits no edema.  Neurological: She is alert and oriented to person, place, and time. She exhibits normal muscle tone.  Skin: Skin is warm. Capillary refill takes less than 2 seconds. There is pallor.  Psychiatric: She has a normal mood and affect.  Nursing note and vitals reviewed.    ED Treatments / Results  Labs (all labs ordered are listed, but only abnormal results are displayed) Labs Reviewed  WET PREP, GENITAL - Abnormal; Notable for the following:       Result Value   WBC, Wet Prep HPF POC FEW (*)    All other components within normal limits  BASIC METABOLIC PANEL - Abnormal; Notable for the following:    CO2 21 (*)    Calcium 8.6 (*)    All other components within normal limits  CBC - Abnormal; Notable for the following:    RBC 3.00 (*)    Hemoglobin 5.5 (*)    HCT 20.2 (*)    MCV 67.3 (*)    MCH 18.3 (*)    MCHC 27.2 (*)    RDW 29.4 (*)    Platelets 104 (*)    All other components within normal limits  URINALYSIS, ROUTINE W REFLEX MICROSCOPIC - Abnormal; Notable for the following:    Hgb urine dipstick SMALL (*)    Ketones, ur 20 (*)    Leukocytes, UA TRACE (*)    All other components within normal limits  PROTIME-INR  CBC  CBC  CBC  TSH  HIV ANTIBODY (ROUTINE TESTING)  BASIC METABOLIC PANEL  APTT  I-STAT BETA HCG BLOOD, ED ([facility], [facility], [facility] ONLY)  TYPE AND SCREEN  PREPARE RBC (CROSSMATCH)  GC/CHLAMYDIA PROBE AMP ([place]) NOT AT [facility]    EKG  EKG Interpretation  Date/Time:  Wednesday February 23 2017 18:08:42 EDT Ventricular Rate:  85 PR Interval:    QRS Duration: 87 QT Interval:  350 QTC Calculation: 417 R Axis:   73 Text Interpretation:  Sinus rhythm No significant change since last tracing Confirmed by Sola Margolis MD, Lysbeth Galas [phone]) on 02/23/2017 6:20:45 PM       Radiology No results found.  Procedures Procedures (including critical care time)  Medications Ordered in  ED Medications  0.9 %  sodium chloride infusion ( Intravenous Transfusing/Transfer 02/23/17 2143)  ferrous gluconate (FERGON) tablet 324 mg (not administered)  polyethylene glycol (MIRALAX / GLYCOLAX) packet 17 g (not administered)  amLODipine (NORVASC) tablet 5 mg (5 mg Oral Given 02/23/17 2130)  hydrALAZINE (APRESOLINE) injection 5 mg (not administered)  ondansetron (ZOFRAN) injection 4 mg (not administered)  nicotine (NICODERM CQ - dosed in mg/24 hours) patch 21 mg (21 mg Transdermal Patch Applied 02/23/17 2130)  megestrol (MEGACE) tablet 40 mg (not administered)  sodium chloride flush (NS) 0.9 % injection 3 mL (not administered)  acetaminophen (TYLENOL) tablet 650 mg (not administered)    Or  acetaminophen (TYLENOL) suppository 650 mg (not administered)  zolpidem (AMBIEN) tablet 5 mg (not administered)  0.9 %  sodium chloride infusion ( Intravenous Stopped 02/23/17 2127)  sodium chloride 0.9 % bolus 1,000 mL (0 mLs Intravenous Stopped 02/23/17 2127)     CRITICAL CARE Performed by: Evonnie Pat   Total critical care time: 35 minutes  Critical care time was exclusive of separately billable procedures and treating other patients.  Critical care was necessary to treat or prevent imminent or life-threatening deterioration.  Critical care was time spent personally by me on the following activities: development of treatment plan with patient and/or surrogate as well as nursing, discussions with consultants, evaluation of patient's response to treatment, examination of patient, obtaining history from patient or surrogate, ordering and performing treatments and interventions, ordering and review of laboratory studies, ordering and review of radiographic studies, pulse oximetry and re-evaluation of patient's condition.    Initial Impression / Assessment and Plan / ED Course  I have reviewed the triage vital signs and the nursing notes.  Pertinent labs & imaging results that were available  during my care of the patient were reviewed by me and considered in my medical decision making (see chart for details).     36 year old female with history of chronic anemia secondary to vaginal bleeding from uterine fibroids here with acute on chronic vaginal bleeding. On arrival, the patient is well-appearing but does have significant pallor. Her vital signs are stable. Labwork shows hemoglobin of 5.5 with microcytic anemia, consistent with acute on chronic blood loss anemia. She is symptomatic from this. EKG is nonischemic. Pelvic exam shows no evidence of significant active arterial bleeding. Patient typed and crossed and will admit for transfusions and further management. D/w Hospitalist, who feels comfortable managing here as pt has stable VS, no active significant bleeding.  Final Clinical Impressions(s) / ED Diagnoses   Final diagnoses:  Uterine leiomyoma, unspecified location  Essential hypertension  Tobacco abuse    New Prescriptions Current Discharge Medication List       Duffy Bruce, MD 02/23/17 2314

## 2017-02-23 NOTE — ED Notes (Signed)
Pt ambulated to restroom to collect urine sample.

## 2017-02-23 NOTE — ED Notes (Signed)
Consent at bedside, awaiting for blood to be ready from blood bank

## 2017-02-23 NOTE — H&P (Signed)
History and Physical    [name] ZCH:[id number] DOB: February 16, 1981 DOA: 02/23/2017  Referring MD/NP/PA:   PCP: Sharene Butters, MD   Patient coming from:  The patient is coming from home.  At baseline, pt is independent for most of ADL.  Chief Complaint: Vaginal bleeding, generalized weakness, lightheadedness, dizziness.  HPI: Cynthia Horn is a 36 y.o. female with medical history significant of fibroids, hypertension, asthma, iron deficiency anemia, tobacco abuse, marijuana abuse, who presents with vaginal bleeding, generalized weakness, lightheadedness and dizziness.  Patient states that she has irregular and heavy menstrual period for long time due to fibroids. She was admitted to hospital a year ago, and started with OCP, but she has not taken this med for long time. She states that she has been having heavy menstrual period since beginning of March, and still has vaginal bleeding currently. She developed lightheadedness, generalized weakness and dizziness. She denies chest pain, shortness rest. She has nausea, but no vomiting or diarrhea. She describes intermittent abdominal cramping but this comes and goes and is not constant.  Denies symptoms of UTI. No unilateral weakness. She's been taking her iron supplements as prescribed but is no longer on oral contraceptives as she lost her primary care doctor.   ED Course: pt was found to have negative pregnancy test, hemoglobin dropped from 9.2 on 12/21/16-5.5, WBC 9.8, INR 0.98, electrolytes renal function okay, temperature normal, blood pressure 150/93, oxygen saturation 100% on room air. Patient is placed on telemetry bed for observation.  Review of Systems:   General: no fevers, chills, no changes in body weight, has fatigue HEENT: no blurry vision, hearing changes or sore throat Respiratory: no dyspnea, coughing, wheezing CV: no chest pain, no palpitations GI: no nausea, vomiting, diarrhea, constipation GU: no dysuria, burning on  urination, increased urinary frequency, hematuria  Ext: no leg edema Neuro: no unilateral weakness, numbness, or tingling, no vision change or hearing loss. Has dizziness and lightheadedness. Skin: no rash, no skin tear. MSK: No muscle spasm, no deformity, no limitation of range of movement in spin Heme: No easy bruising.  Travel history: No recent long distant travel.  Allergy: No Known Allergies  Past Medical History:  Diagnosis Date  . Anemia   . Fibroid uterus   . Hypertension   . Tobacco abuse     Past Surgical History:  Procedure Laterality Date  . ENDOMETRIAL ABLATION W/ NOVASURE      Social History:  reports that she has been smoking Cigarettes.  She has been smoking about 0.25 packs per day. She has never used smokeless tobacco. She reports that she drinks alcohol. She reports that she uses drugs, including Marijuana.  Family History:  Family History  Problem Relation Age of Onset  . Hypertension Mother   . Stroke Sister      Prior to Admission medications   Medication Sig Start Date End Date Taking? Authorizing Provider  Ferrous Gluconate (IRON 27 PO) Take 1 tablet by mouth 2 (two) times daily.   Yes Historical Provider, MD  ibuprofen (ADVIL,MOTRIN) 200 MG tablet Take 800 mg by mouth every 6 (six) hours as needed for headache, moderate pain or cramping.    Yes Historical Provider, MD  doxycycline (VIBRAMYCIN) 100 MG capsule Take 1 capsule (100 mg total) by mouth 2 (two) times daily. Patient not taking: Reported on 02/23/2017 02/09/17   Blanchie Dessert, MD  norgestimate-ethinyl estradiol (ORTHO-CYCLEN,SPRINTEC,PREVIFEM) 0.25-35 MG-MCG tablet Take 1 tablet by mouth daily. Patient not taking: Reported on 02/23/2017 02/08/16   Waldon Reining  Barbette Reichmann, MD    Physical Exam: Vitals:   02/23/17 2224 02/23/17 2255 02/23/17 2315 02/24/17 0042  BP: (!) 147/90 (!) 148/88 (!) 145/87 (!) 153/83  Pulse: 81 84 88 96  Resp: 18 20 18 18   Temp: 98.2 F (36.8 C) 98.4 F (36.9 C) 98.4  F (36.9 C) 99.2 F (37.3 C)  TempSrc: Oral Oral Oral Oral  SpO2: 100%   98%   General: Not in acute distress. Pale looking. HEENT:       Eyes: PERRL, EOMI, no scleral icterus.       ENT: No discharge from the ears and nose, no pharynx injection, no tonsillar enlargement.        Neck: No JVD, no bruit, no mass felt. Heme: No neck lymph node enlargement. Cardiac: S1/S2, RRR, No murmurs, No gallops or rubs. Respiratory:  No rales, wheezing, rhonchi or rubs. GI: Soft, nondistended, nontender, no rebound pain, no organomegaly, BS present. GU: No hematuria Ext: No pitting leg edema bilaterally. 2+DP/PT pulse bilaterally. Musculoskeletal: No joint deformities, No joint redness or warmth, no limitation of ROM in spin. Skin: No rashes.  Neuro: Alert, oriented X3, cranial nerves II-XII grossly intact, moves all extremities normally.  Psych: Patient is not psychotic, no suicidal or hemocidal ideation.  Labs on Admission: I have personally reviewed following labs and imaging studies  CBC:  Recent Labs Lab 02/23/17 1634 02/23/17 2215 02/24/17 0200  WBC 9.8 13.8* 12.2*  HGB 5.5* 6.4* 7.1*  HCT 20.2* 22.9* 24.8*  MCV 67.3* 69.8* 71.3*  PLT 104* 105* 408*   Basic Metabolic Panel:  Recent Labs Lab 02/23/17 1634  NA 137  K 3.5  CL 110  CO2 21*  GLUCOSE 93  BUN 8  CREATININE 0.63  CALCIUM 8.6*   GFR: CrCl cannot be calculated (Unknown ideal weight.). Liver Function Tests: No results for input(s): AST, ALT, ALKPHOS, BILITOT, PROT, ALBUMIN in the last 168 hours. No results for input(s): LIPASE, AMYLASE in the last 168 hours. No results for input(s): AMMONIA in the last 168 hours. Coagulation Profile:  Recent Labs Lab 02/23/17 1753  INR 0.98   Cardiac Enzymes: No results for input(s): CKTOTAL, CKMB, CKMBINDEX, TROPONINI in the last 168 hours. BNP (last 3 results) No results for input(s): PROBNP in the last 8760 hours. HbA1C: No results for input(s): HGBA1C in the last  72 hours. CBG: No results for input(s): GLUCAP in the last 168 hours. Lipid Profile: No results for input(s): CHOL, HDL, LDLCALC, TRIG, CHOLHDL, LDLDIRECT in the last 72 hours. Thyroid Function Tests:  Recent Labs  02/24/17 0200  TSH 0.709   Anemia Panel: No results for input(s): VITAMINB12, FOLATE, FERRITIN, TIBC, IRON, RETICCTPCT in the last 72 hours. Urine analysis:    Component Value Date/Time   COLORURINE YELLOW 02/23/2017 1750   APPEARANCEUR CLEAR 02/23/2017 1750   LABSPEC 1.016 02/23/2017 1750   PHURINE 6.0 02/23/2017 1750   GLUCOSEU NEGATIVE 02/23/2017 1750   HGBUR SMALL (A) 02/23/2017 1750   BILIRUBINUR NEGATIVE 02/23/2017 1750   KETONESUR 20 (A) 02/23/2017 1750   PROTEINUR NEGATIVE 02/23/2017 1750   NITRITE NEGATIVE 02/23/2017 1750   LEUKOCYTESUR TRACE (A) 02/23/2017 1750   Sepsis Labs: @LABRCNTIP (procalcitonin:4,lacticidven:4) ) Recent Results (from the past 240 hour(s))  Wet prep, genital     Status: Abnormal   Collection Time: 02/23/17  6:58 PM  Result Value Ref Range Status   Yeast Wet Prep HPF POC NONE SEEN NONE SEEN Final   Trich, Wet Prep NONE SEEN NONE SEEN  Final   Clue Cells Wet Prep HPF POC NONE SEEN NONE SEEN Final   WBC, Wet Prep HPF POC FEW (A) NONE SEEN Final   Sperm NONE SEEN  Final     Radiological Exams on Admission: No results found.   EKG: Independently reviewed. Sinus rhythm, QTC 417, no ischemic change.   Assessment/Plan Principal Problem:   Symptomatic anemia Active Problems:   Fibroid uterus   Hypertension   Tobacco abuse   Vaginal bleeding   Symptomatic anemia: this is due to Heavy menstrual period secondary to uterine fibroids. Patient had a transvaginal ultrasound on 02/09/17, which showed multiple myometrial masses, consistent with fibroids. Her hemoglobin dropped from 9.2 on 12/21/16-->5.5 today. Patient states that she did not see OB/GYN for more than one year, but she has a new appointment with OB/GYN on  03/07/17.  -will place on tele bed for obs - transfuse 2 units of PRBCs  - PT, PTT, type/screen - CBC every 6 hours - will start megace 40 mg bid - hold Ibuprofen - f/u with OB/Gyn  Hypertension: bp 150/93. Pt is not taking meds at home -start amlodipine 5 mg daily -IVF hydralazine when necessary  Tobacco abuse: -Did counseling about importance of quitting smoking -Nicotine patch  DVT ppx: SCD Code Status: Full code Family Communication: None at bed side. Disposition Plan:  Anticipate discharge back to previous home environment Consults called:  none Admission status: Obs / tele   Date of Service 02/24/2017    Ivor Costa [facility] Pager [phone]  If 7PM-7AM, please contact night-coverage [email or web address] Password [email or web address] 02/24/2017, 3:21 AM

## 2017-02-23 NOTE — ED Triage Notes (Signed)
Pt reports red vaginal bleeding for the past month, states she was seen at [facility] a few weeks ago, given abx that she finished but continues to have bleeding and feel weak and lightheaded, similar to how she felt in the past when she need a transfusion. Pt a/ox4, resp e/u, skin pale.

## 2017-02-24 ENCOUNTER — Encounter (HOSPITAL_COMMUNITY): Payer: Self-pay | Admitting: Internal Medicine

## 2017-02-24 DIAGNOSIS — Z720 Tobacco use: Secondary | ICD-10-CM | POA: Diagnosis not present

## 2017-02-24 DIAGNOSIS — D259 Leiomyoma of uterus, unspecified: Secondary | ICD-10-CM

## 2017-02-24 DIAGNOSIS — D649 Anemia, unspecified: Secondary | ICD-10-CM | POA: Diagnosis not present

## 2017-02-24 DIAGNOSIS — I10 Essential (primary) hypertension: Secondary | ICD-10-CM | POA: Diagnosis not present

## 2017-02-24 DIAGNOSIS — D696 Thrombocytopenia, unspecified: Secondary | ICD-10-CM | POA: Diagnosis not present

## 2017-02-24 LAB — TYPE AND SCREEN
ABO/RH(D): O POS
Antibody Screen: NEGATIVE
Unit division: 0
Unit division: 0

## 2017-02-24 LAB — BPAM RBC
Blood Product Expiration Date: 201804292359
Blood Product Expiration Date: 201804292359
ISSUE DATE / TIME: 201804042249
ISSUE DATE / TIME: 201804041938
Unit Type and Rh: 5100
Unit Type and Rh: 5100

## 2017-02-24 LAB — CBC WITH DIFFERENTIAL/PLATELET
BLASTS: 0 %
Band Neutrophils: 0 %
Basophils Absolute: 0 10*3/uL (ref 0.0–0.1)
Basophils Relative: 0 %
EOS ABS: 0.3 10*3/uL (ref 0.0–0.7)
Eosinophils Relative: 2 %
Lymphocytes Relative: 21 %
Lymphs Abs: 2.6 10*3/uL (ref 0.7–4.0)
Metamyelocytes Relative: 0 %
Monocytes Absolute: 0.6 10*3/uL (ref 0.1–1.0)
Monocytes Relative: 5 %
Myelocytes: 0 %
Neutro Abs: 8.7 10*3/uL — ABNORMAL HIGH (ref 1.7–7.7)
Neutrophils Relative %: 72 %
OTHER: 0 %
PROMYELOCYTES ABS: 0 %
nRBC: 0 /100 WBC

## 2017-02-24 LAB — CBC
HCT: 26.3 % — ABNORMAL LOW (ref 36.0–46.0)
HEMATOCRIT: 24.8 % — AB (ref 36.0–46.0)
HEMOGLOBIN: 7.1 g/dL — AB (ref 12.0–15.0)
HEMOGLOBIN: 7.6 g/dL — AB (ref 12.0–15.0)
MCH: 20.4 pg — AB (ref 26.0–34.0)
MCH: 20.5 pg — ABNORMAL LOW (ref 26.0–34.0)
MCHC: 28.6 g/dL — AB (ref 30.0–36.0)
MCHC: 28.9 g/dL — ABNORMAL LOW (ref 30.0–36.0)
MCV: 71.3 fL — AB (ref 78.0–100.0)
MCV: 71.1 fL — ABNORMAL LOW (ref 78.0–100.0)
Platelets: 102 10*3/uL — ABNORMAL LOW (ref 150–400)
Platelets: 116 10*3/uL — ABNORMAL LOW (ref 150–400)
RBC: 3.48 MIL/uL — ABNORMAL LOW (ref 3.87–5.11)
RBC: 3.7 MIL/uL — AB (ref 3.87–5.11)
RDW: 29.2 % — ABNORMAL HIGH (ref 11.5–15.5)
RDW: 29 % — ABNORMAL HIGH (ref 11.5–15.5)
WBC: 12.2 10*3/uL — ABNORMAL HIGH (ref 4.0–10.5)
WBC: 11.2 10*3/uL — AB (ref 4.0–10.5)

## 2017-02-24 LAB — TSH: TSH: 0.709 u[IU]/mL (ref 0.350–4.500)

## 2017-02-24 LAB — BASIC METABOLIC PANEL
Anion gap: 6 (ref 5–15)
BUN: <5 mg/dL — ABNORMAL LOW (ref 6–20)
CHLORIDE: 111 mmol/L (ref 101–111)
CO2: 19 mmol/L — ABNORMAL LOW (ref 22–32)
CREATININE: 0.63 mg/dL (ref 0.44–1.00)
Calcium: 8.3 mg/dL — ABNORMAL LOW (ref 8.9–10.3)
GFR calc Af Amer: >60 mL/min (ref >60)
GFR calc non Af Amer: >60 mL/min (ref >60)
Glucose, Bld: 75 mg/dL (ref 65–99)
Potassium: 3.2 mmol/L — ABNORMAL LOW (ref 3.5–5.1)
SODIUM: 136 mmol/L (ref 135–145)

## 2017-02-24 LAB — HIV ANTIBODY (ROUTINE TESTING W REFLEX): HIV SCREEN 4TH GENERATION: NONREACTIVE

## 2017-02-24 LAB — GC/CHLAMYDIA PROBE AMP (~~LOC~~) NOT AT ARMC
Chlamydia: NEGATIVE
Neisseria Gonorrhea: NEGATIVE

## 2017-02-24 LAB — GLUCOSE, CAPILLARY: GLUCOSE-CAPILLARY: 82 mg/dL (ref 65–99)

## 2017-02-24 MED ORDER — AMLODIPINE BESYLATE 5 MG PO TABS: 5.0000 mg | ORAL_TABLET | Freq: Every day | ORAL | 0 refills | Status: AC

## 2017-02-24 NOTE — Discharge Summary (Signed)
Physician Discharge Summary  Cynthia Horn YTK:[id number] DOB: 1981-07-02 DOA: 02/23/2017  PCP: Sharene Butters, MD  Admit date: 02/23/2017 Discharge date: 02/24/2017  Admitted From: Home Disposition: Home  Recommendations for Outpatient Follow-up:  1. Follow up with PCP in 1 week 2. Follow up with Ob/Gyn on 4/16 3. Please obtain CBC in one week to recheck hemoglobin and platelets 4. Recommend patient go to infusion clinic for repeat blood infusions if needed for recurrent symptomatic anemia   Discharge Condition: Stable CODE STATUS: Full code Diet recommendation: Heart healthy   Brief/Interim Summary:  Admission HPI written by Ivor Costa, MD   Chief Complaint: Vaginal bleeding, generalized weakness, lightheadedness, dizziness.  HPI: Cynthia Horn is a 36 y.o. female with medical history significant of fibroids, hypertension, asthma, iron deficiency anemia, tobacco abuse, marijuana abuse, who presents with vaginal bleeding, generalized weakness, lightheadedness and dizziness.  Patient states that she has irregular and heavy menstrual period for long time due to fibroids. She was admitted to hospital a year ago, and started with OCP, but she has not taken this med for long time. She states that she has been having heavy menstrual period since beginning of March, and still has vaginal bleeding currently. She developed lightheadedness, generalized weakness and dizziness. She denies chest pain, shortness rest. She has nausea, but no vomiting or diarrhea. She describes intermittent abdominal cramping but this comes and goes and is not constant.  Denies symptoms of UTI. No unilateral weakness. She's been taking her iron supplements as prescribed but is no longer on oral contraceptives as she lost her primary care doctor.   ED Course: pt was found to have negative pregnancy test, hemoglobin dropped from 9.2 on 12/21/16-5.5, WBC 9.8, INR 0.98, electrolytes renal function okay, temperature normal,  blood pressure 150/93, oxygen saturation 100% on room air. Patient is placed on telemetry bed for observation.    Hospital course:  Symptomatic anemia Patient presented with a hemoglobin of 5.5. She was given two units of packed red blood cells with an improved hemoglobin of 7.6. Vaginal bleeding described as spotting by patient. Patient given megace while in the hospital. Follow-up with Ob/Gyn. May need repeat blood transfusions in the future if heavy bleeding restarts.  Essential hypertension Started amlodipine 5mg . May discontinue if blood pressure improves.  Thrombocytopenia Chronic. Stable. Likely contributing to heavy bleeding. Outpatient management.  Tobacco abuse Counseled on admission  Discharge Diagnoses:  Principal Problem:   Symptomatic anemia Active Problems:   Fibroid uterus   Hypertension   Tobacco abuse   Vaginal bleeding    Discharge Instructions  Discharge Instructions    Call MD for:  difficulty breathing, headache or visual disturbances    Complete by:  As directed    Call MD for:  extreme fatigue    Complete by:  As directed    Call MD for:  persistant dizziness or light-headedness    Complete by:  As directed    Call MD for:  persistant nausea and vomiting    Complete by:  As directed    Diet - low sodium heart healthy    Complete by:  As directed    Increase activity slowly    Complete by:  As directed      Allergies as of 02/24/2017   No Known Allergies     Medication List    STOP taking these medications   doxycycline 100 MG capsule Commonly known as:  VIBRAMYCIN     TAKE these medications   amLODipine 5 MG tablet  Commonly known as:  NORVASC Take 1 tablet (5 mg total) by mouth daily. Start taking on:  02/25/2017   ibuprofen 200 MG tablet Commonly known as:  ADVIL,MOTRIN Take 800 mg by mouth every 6 (six) hours as needed for headache, moderate pain or cramping.   IRON 27 PO Take 1 tablet by mouth 2 (two) times daily.    norgestimate-ethinyl estradiol 0.25-35 MG-MCG tablet Commonly known as:  ORTHO-CYCLEN,SPRINTEC,PREVIFEM Take 1 tablet by mouth daily.      Follow-up Information    Sharene Butters, MD. Schedule an appointment as soon as possible for a visit in 1 week(s).   Specialty:  Obstetrics and Gynecology Contact information: [address] [place] [address] [phone]          No Known Allergies  Consultations:  None   Procedures/Studies: US Transvaginal Non-ob  Result Date: 02/09/2017 CLINICAL DATA:  Pelvic pain EXAM: TRANSABDOMINAL AND TRANSVAGINAL ULTRASOUND OF PELVIS DOPPLER ULTRASOUND OF OVARIES TECHNIQUE: Both transabdominal and transvaginal ultrasound examinations of the pelvis were performed. Transabdominal technique was performed for global imaging of the pelvis including uterus, ovaries, adnexal regions, and pelvic cul-de-sac. It was necessary to proceed with endovaginal exam following the transabdominal exam to visualize the endometrium and ovaries. Color and duplex Doppler ultrasound was utilized to evaluate blood flow to the ovaries. COMPARISON:  None. FINDINGS: Uterus Measurements: 9.8 x 7.2 x 8.4 cm. Heterogenous echotexture. Multiple myometrial masses, consistent with fibroids. The right posterior fibroid measures 4.3 x 3.5 x 4.5 cm. Exophytic posterior fundal fibroid measures 2.3 x 3.5 x 3 cm. Left anterior myometrial fibroid measures 3 x 2.8 x 3.2 cm. Small fundal fibroid measures 2.9 x 1.3 x 2.5 cm. Endometrium Thickness: 4.3 mm. Complex fluid is present within the endometrial canal. Right ovary Measurements: 3.3 x 1.2 x 2.6 cm. Normal appearance/no adnexal mass. Left ovary Measurements: 2.7 x 1.7 x 1.6 cm. Normal appearance/no adnexal mass. Pulsed Doppler evaluation of both ovaries demonstrates normal low-resistance arterial and venous waveforms. Other findings No abnormal free fluid. IMPRESSION: 1. No sonographic evidence for ovarian torsion 2.  Heterogenous uterus containing multiple fibroids. 3. Small to moderate complex fluid present in the endometrial canal, may represent blood products or possible infected fluid. Electronically Signed   By: Donavan Foil M.D.   On: 02/09/2017 17:02   US Pelvis Complete  Result Date: 02/09/2017 CLINICAL DATA:  Pelvic pain EXAM: TRANSABDOMINAL AND TRANSVAGINAL ULTRASOUND OF PELVIS DOPPLER ULTRASOUND OF OVARIES TECHNIQUE: Both transabdominal and transvaginal ultrasound examinations of the pelvis were performed. Transabdominal technique was performed for global imaging of the pelvis including uterus, ovaries, adnexal regions, and pelvic cul-de-sac. It was necessary to proceed with endovaginal exam following the transabdominal exam to visualize the endometrium and ovaries. Color and duplex Doppler ultrasound was utilized to evaluate blood flow to the ovaries. COMPARISON:  None. FINDINGS: Uterus Measurements: 9.8 x 7.2 x 8.4 cm. Heterogenous echotexture. Multiple myometrial masses, consistent with fibroids. The right posterior fibroid measures 4.3 x 3.5 x 4.5 cm. Exophytic posterior fundal fibroid measures 2.3 x 3.5 x 3 cm. Left anterior myometrial fibroid measures 3 x 2.8 x 3.2 cm. Small fundal fibroid measures 2.9 x 1.3 x 2.5 cm. Endometrium Thickness: 4.3 mm. Complex fluid is present within the endometrial canal. Right ovary Measurements: 3.3 x 1.2 x 2.6 cm. Normal appearance/no adnexal mass. Left ovary Measurements: 2.7 x 1.7 x 1.6 cm. Normal appearance/no adnexal mass. Pulsed Doppler evaluation of both ovaries demonstrates normal low-resistance arterial and venous waveforms. Other findings  No abnormal free fluid. IMPRESSION: 1. No sonographic evidence for ovarian torsion 2. Heterogenous uterus containing multiple fibroids. 3. Small to moderate complex fluid present in the endometrial canal, may represent blood products or possible infected fluid. Electronically Signed   By: Donavan Foil M.D.   On: 02/09/2017 17:02    [place] Art/ven Flow Abd Pelv Doppler  Result Date: 02/09/2017 CLINICAL DATA:  Pelvic pain EXAM: TRANSABDOMINAL AND TRANSVAGINAL ULTRASOUND OF PELVIS DOPPLER ULTRASOUND OF OVARIES TECHNIQUE: Both transabdominal and transvaginal ultrasound examinations of the pelvis were performed. Transabdominal technique was performed for global imaging of the pelvis including uterus, ovaries, adnexal regions, and pelvic cul-de-sac. It was necessary to proceed with endovaginal exam following the transabdominal exam to visualize the endometrium and ovaries. Color and duplex Doppler ultrasound was utilized to evaluate blood flow to the ovaries. COMPARISON:  None. FINDINGS: Uterus Measurements: 9.8 x 7.2 x 8.4 cm. Heterogenous echotexture. Multiple myometrial masses, consistent with fibroids. The right posterior fibroid measures 4.3 x 3.5 x 4.5 cm. Exophytic posterior fundal fibroid measures 2.3 x 3.5 x 3 cm. Left anterior myometrial fibroid measures 3 x 2.8 x 3.2 cm. Small fundal fibroid measures 2.9 x 1.3 x 2.5 cm. Endometrium Thickness: 4.3 mm. Complex fluid is present within the endometrial canal. Right ovary Measurements: 3.3 x 1.2 x 2.6 cm. Normal appearance/no adnexal mass. Left ovary Measurements: 2.7 x 1.7 x 1.6 cm. Normal appearance/no adnexal mass. Pulsed Doppler evaluation of both ovaries demonstrates normal low-resistance arterial and venous waveforms. Other findings No abnormal free fluid. IMPRESSION: 1. No sonographic evidence for ovarian torsion 2. Heterogenous uterus containing multiple fibroids. 3. Small to moderate complex fluid present in the endometrial canal, may represent blood products or possible infected fluid. Electronically Signed   By: Donavan Foil M.D.   On: 02/09/2017 17:02      Subjective: Vaginal spotting. No lightheadedness, chest pain or dyspnea.  Discharge Exam: Vitals:   02/24/17 0532 02/24/17 0846  BP: 131/76 (!) 142/86  Pulse: 81   Resp: 18   Temp: 98.1 F (36.7 C)    Vitals:    02/23/17 2315 02/24/17 0042 02/24/17 0532 02/24/17 0846  BP: (!) 145/87 (!) 153/83 131/76 (!) 142/86  Pulse: 88 96 81   Resp: 18 18 18    Temp: 98.4 F (36.9 C) 99.2 F (37.3 C) 98.1 F (36.7 C)   TempSrc: Oral Oral Oral   SpO2:  98% 100%     General: Pt is alert, awake, not in acute distress Cardiovascular: RRR, S1/S2 + Respiratory: CTA bilaterally, no wheezing, no rhonchi Abdominal: Soft, NT, ND, bowel sounds + Extremities: no edema, no cyanosis    The results of significant diagnostics from this hospitalization (including imaging, microbiology, ancillary and laboratory) are listed below for reference.     Microbiology: Recent Results (from the past 240 hour(s))  Wet prep, genital     Status: Abnormal   Collection Time: 02/23/17  6:58 PM  Result Value Ref Range Status   Yeast Wet Prep HPF POC NONE SEEN NONE SEEN Final   Trich, Wet Prep NONE SEEN NONE SEEN Final   Clue Cells Wet Prep HPF POC NONE SEEN NONE SEEN Final   WBC, Wet Prep HPF POC FEW (A) NONE SEEN Final   Sperm NONE SEEN  Final     Labs:  Basic Metabolic Panel:  Recent Labs Lab 02/23/17 1634 02/24/17 0818  NA 137 136  K 3.5 3.2*  CL 110 111  CO2 21* 19*  GLUCOSE 93 75  BUN 8 <5*  CREATININE 0.63 0.63  CALCIUM 8.6* 8.3*   CBC:  Recent Labs Lab 02/23/17 1634 02/23/17 2215 02/24/17 0200 02/24/17 0350 02/24/17 0819  WBC 9.8 33.8* 32.9* DUPLICATE REQUEST 19.1*  NEUTROABS  --   --   --  8.7*  --   HGB 5.5* 6.4* 7.1* DUPLICATE REQUEST 7.6*  HCT 20.2* 66.0* 60.0* DUPLICATE REQUEST 45.9*  MCV 67.3* 97.7* 41.4* DUPLICATE REQUEST 23.9*  PLT 532* 023* 343* DUPLICATE REQUEST 568*   CBG:  Recent Labs Lab 02/24/17 0821  GLUCAP 82   Thyroid function studies  Recent Labs  02/24/17 0200  TSH 0.709   Urinalysis    Component Value Date/Time   COLORURINE YELLOW 02/23/2017 1750   APPEARANCEUR CLEAR 02/23/2017 1750   LABSPEC 1.016 02/23/2017 1750   PHURINE 6.0 02/23/2017 1750   GLUCOSEU  NEGATIVE 02/23/2017 1750   HGBUR SMALL (A) 02/23/2017 1750   BILIRUBINUR NEGATIVE 02/23/2017 1750   KETONESUR 20 (A) 02/23/2017 1750   PROTEINUR NEGATIVE 02/23/2017 1750   NITRITE NEGATIVE 02/23/2017 1750   LEUKOCYTESUR TRACE (A) 02/23/2017 1750   Sepsis Labs Invalid input(s): PROCALCITONIN,  WBC,  LACTICIDVEN Microbiology Recent Results (from the past 240 hour(s))  Wet prep, genital     Status: Abnormal   Collection Time: 02/23/17  6:58 PM  Result Value Ref Range Status   Yeast Wet Prep HPF POC NONE SEEN NONE SEEN Final   Trich, Wet Prep NONE SEEN NONE SEEN Final   Clue Cells Wet Prep HPF POC NONE SEEN NONE SEEN Final   WBC, Wet Prep HPF POC FEW (A) NONE SEEN Final   Sperm NONE SEEN  Final     SIGNED:   Cordelia Poche, MD [facility] 02/24/2017, 12:00 PM Pager [phone]  If 7PM-7AM, please contact night-coverage [email or web address] Password [email or web address]

## 2017-02-24 NOTE — Progress Notes (Signed)
Cynthia Horn to be D/C'd Home per MD order.  Discussed with the patient and all questions fully answered.  VSS, Skin clean, dry and intact without evidence of skin break down, no evidence of skin tears noted. IV catheter discontinued intact. Site without signs and symptoms of complications. Dressing and pressure applied.  An After Visit Summary was printed and given to the patient. Patient received prescription.  D/c education completed with patient/family including follow up instructions, medication list, d/c activities limitations if indicated, with other d/c instructions as indicated by MD - patient able to verbalize understanding, all questions fully answered.   Patient instructed to return to ED, call 911, or call MD for any changes in condition.   Patient refused to be escorted via [facility], walked to exit and D/C'd home via private auto.  [name] 02/24/2017 2:27 PM

## 2017-02-24 NOTE — Discharge Instructions (Signed)
Anemia, Nonspecific Anemia is a condition in which the concentration of red blood cells or hemoglobin in the blood is below normal. Hemoglobin is a substance in red blood cells that carries oxygen to the tissues of the body. Anemia results in not enough oxygen reaching these tissues. What are the causes? Common causes of anemia include:  Excessive bleeding. Bleeding may be internal or external. This includes excessive bleeding from periods (in women) or from the intestine.  Poor nutrition.  Chronic kidney, thyroid, and liver disease.  Bone marrow disorders that decrease red blood cell production.  Cancer and treatments for cancer.  HIV, AIDS, and their treatments.  Spleen problems that increase red blood cell destruction.  Blood disorders.  Excess destruction of red blood cells due to infection, medicines, and autoimmune disorders. What are the signs or symptoms?  Minor weakness.  Dizziness.  Headache.  Palpitations.  Shortness of breath, especially with exercise.  Paleness.  Cold sensitivity.  Indigestion.  Nausea.  Difficulty sleeping.  Difficulty concentrating. Symptoms may occur suddenly or they may develop slowly. How is this diagnosed? Additional blood tests are often needed. These help your health care provider determine the best treatment. Your health care provider will check your stool for blood and look for other causes of blood loss. How is this treated? Treatment varies depending on the cause of the anemia. Treatment can include:  Supplements of iron, vitamin B12, or folic acid.  Hormone medicines.  A blood transfusion. This may be needed if blood loss is severe.  Hospitalization. This may be needed if there is significant continual blood loss.  Dietary changes.  Spleen removal. Follow these instructions at home: Keep all follow-up appointments. It often takes many weeks to correct anemia, and having your health care provider check on your  condition and your response to treatment is very important. Get help right away if:  You develop extreme weakness, shortness of breath, or chest pain.  You become dizzy or have trouble concentrating.  You develop heavy vaginal bleeding.  You develop a rash.  You have bloody or black, tarry stools.  You faint.  You vomit up blood.  You vomit repeatedly.  You have abdominal pain.  You have a fever or persistent symptoms for more than 2-3 days.  You have a fever and your symptoms suddenly get worse.  You are dehydrated. This information is not intended to replace advice given to you by your health care provider. Make sure you discuss any questions you have with your health care provider. Document Released: 12/16/2004 Document Revised: 04/21/2016 Document Reviewed: 05/04/2013 Elsevier Interactive Patient Education  2017 [employer] Inc[employer]  

## 2017-04-01 ENCOUNTER — Encounter: Payer: Self-pay | Admitting: Family Medicine

## 2017-10-11 ENCOUNTER — Ambulatory Visit: Payer: Self-pay | Admitting: Obstetrics and Gynecology

## 2018-01-27 ENCOUNTER — Other Ambulatory Visit: Payer: Self-pay

## 2018-01-27 ENCOUNTER — Observation Stay (HOSPITAL_COMMUNITY)
Admission: EM | Admit: 2018-01-27 | Discharge: 2018-01-28 | Disposition: A | Discharge status: Home / Self Care | Payer: Self-pay | Attending: Internal Medicine | Admitting: Internal Medicine

## 2018-01-27 ENCOUNTER — Encounter (HOSPITAL_COMMUNITY): Payer: Self-pay | Admitting: Emergency Medicine

## 2018-01-27 DIAGNOSIS — Z9114 Patient's other noncompliance with medication regimen: Secondary | ICD-10-CM

## 2018-01-27 DIAGNOSIS — D649 Anemia, unspecified: Secondary | ICD-10-CM

## 2018-01-27 DIAGNOSIS — F17210 Nicotine dependence, cigarettes, uncomplicated: Secondary | ICD-10-CM | POA: Insufficient documentation

## 2018-01-27 DIAGNOSIS — N920 Excessive and frequent menstruation with regular cycle: Principal | ICD-10-CM

## 2018-01-27 DIAGNOSIS — I10 Essential (primary) hypertension: Secondary | ICD-10-CM

## 2018-01-27 DIAGNOSIS — F17211 Nicotine dependence, cigarettes, in remission: Secondary | ICD-10-CM

## 2018-01-27 DIAGNOSIS — G2581 Restless legs syndrome: Secondary | ICD-10-CM

## 2018-01-27 DIAGNOSIS — Z79899 Other long term (current) drug therapy: Secondary | ICD-10-CM | POA: Insufficient documentation

## 2018-01-27 DIAGNOSIS — D259 Leiomyoma of uterus, unspecified: Secondary | ICD-10-CM

## 2018-01-27 DIAGNOSIS — D509 Iron deficiency anemia, unspecified: Secondary | ICD-10-CM | POA: Insufficient documentation

## 2018-01-27 DIAGNOSIS — D500 Iron deficiency anemia secondary to blood loss (chronic): Secondary | ICD-10-CM

## 2018-01-27 DIAGNOSIS — Z793 Long term (current) use of hormonal contraceptives: Secondary | ICD-10-CM | POA: Insufficient documentation

## 2018-01-27 DIAGNOSIS — D573 Sickle-cell trait: Secondary | ICD-10-CM | POA: Insufficient documentation

## 2018-01-27 DIAGNOSIS — Z9889 Other specified postprocedural states: Secondary | ICD-10-CM

## 2018-01-27 DIAGNOSIS — R231 Pallor: Secondary | ICD-10-CM

## 2018-01-27 HISTORY — DX: Abnormal uterine and vaginal bleeding, unspecified: N93.9

## 2018-01-27 LAB — COMPREHENSIVE METABOLIC PANEL
ALBUMIN: 3.4 g/dL — AB (ref 3.5–5.0)
ALT: 11 U/L — ABNORMAL LOW (ref 14–54)
ANION GAP: 10 (ref 5–15)
AST: 23 U/L (ref 15–41)
Alkaline Phosphatase: 81 U/L (ref 38–126)
BILIRUBIN TOTAL: 0.4 mg/dL (ref 0.3–1.2)
BUN: <5 mg/dL — ABNORMAL LOW (ref 6–20)
CHLORIDE: 108 mmol/L (ref 101–111)
CO2: 20 mmol/L — ABNORMAL LOW (ref 22–32)
Calcium: 8.8 mg/dL — ABNORMAL LOW (ref 8.9–10.3)
Creatinine, Ser: 0.68 mg/dL (ref 0.44–1.00)
GFR calc Af Amer: >60 mL/min (ref >60)
GFR calc non Af Amer: >60 mL/min (ref >60)
Glucose, Bld: 107 mg/dL — ABNORMAL HIGH (ref 65–99)
POTASSIUM: 3.7 mmol/L (ref 3.5–5.1)
Sodium: 138 mmol/L (ref 135–145)
Total Protein: 6.7 g/dL (ref 6.5–8.1)

## 2018-01-27 LAB — CBC WITH DIFFERENTIAL/PLATELET
Basophils Absolute: 0 10*3/uL (ref 0.0–0.1)
Basophils Relative: 0 %
EOS PCT: 1 %
Eosinophils Absolute: 0.1 10*3/uL (ref 0.0–0.7)
HCT: 19.1 % — ABNORMAL LOW (ref 36.0–46.0)
HEMOGLOBIN: 5.1 g/dL — AB (ref 12.0–15.0)
LYMPHS PCT: 21 %
Lymphs Abs: 1.8 10*3/uL (ref 0.7–4.0)
MCH: 17.9 pg — ABNORMAL LOW (ref 26.0–34.0)
MCHC: 26.7 g/dL — ABNORMAL LOW (ref 30.0–36.0)
MCV: 67 fL — ABNORMAL LOW (ref 78.0–100.0)
MONOS PCT: 6 %
Monocytes Absolute: 0.5 10*3/uL (ref 0.1–1.0)
Neutro Abs: 6 10*3/uL (ref 1.7–7.7)
Neutrophils Relative %: 72 %
Platelets: 94 10*3/uL — ABNORMAL LOW (ref 150–400)
RBC: 2.85 MIL/uL — AB (ref 3.87–5.11)
RDW: 29.2 % — ABNORMAL HIGH (ref 11.5–15.5)
WBC: 8.4 10*3/uL (ref 4.0–10.5)

## 2018-01-27 LAB — CBC
HCT: 23.3 % — ABNORMAL LOW (ref 36.0–46.0)
Hemoglobin: 7 g/dL — ABNORMAL LOW (ref 12.0–15.0)
MCH: 21.5 pg — ABNORMAL LOW (ref 26.0–34.0)
MCHC: 30 g/dL (ref 30.0–36.0)
MCV: 71.7 fL — ABNORMAL LOW (ref 78.0–100.0)
PLATELETS: 80 10*3/uL — AB (ref 150–400)
RBC: 3.25 MIL/uL — ABNORMAL LOW (ref 3.87–5.11)
RDW: 28.2 % — AB (ref 11.5–15.5)
WBC: 9.6 10*3/uL (ref 4.0–10.5)

## 2018-01-27 LAB — I-STAT BETA HCG BLOOD, ED (MC, WL, AP ONLY)

## 2018-01-27 LAB — PREPARE RBC (CROSSMATCH)

## 2018-01-27 MED ORDER — SODIUM CHLORIDE 0.9 % IV SOLN
10.0000 mL/h | Freq: Once | INTRAVENOUS | Status: AC
  Administered 2018-01-27: 10 mL/h via INTRAVENOUS

## 2018-01-27 MED ORDER — IBUPROFEN 400 MG PO TABS: 400.0000 mg | ORAL_TABLET | Freq: Four times a day (QID) | ORAL | Status: DC | PRN

## 2018-01-27 MED ORDER — SODIUM CHLORIDE 0.9 % IV SOLN
INTRAVENOUS | Status: DC
  Administered 2018-01-27 – 2018-01-28 (×3): via INTRAVENOUS

## 2018-01-27 NOTE — ED Provider Notes (Signed)
[facility] EMERGENCY DEPARTMENT Provider Note   CSN: [id number] Arrival date & time: 01/27/18  1043     History   Chief Complaint Chief Complaint  Patient presents with  . Anemia    HPI Mary Secord is a 37 y.o. female with history of uterine fibroids, hypertension, and symptomatic anemia presents today for evaluation of 2 weeks of fatigue, shortness of breath, lightheadedness, and tachycardia.  She states that this feels like the last time she required a blood transfusion.  She has very heavy periods and has been menstruating for the last month.  She notes shortness of breath on exertion.  Denies fevers, chills, chest pain, or syncope.  Denies pain.  Has not tried anything for her symptoms.  The history is provided by the patient.    Past Medical History:  Diagnosis Date  . Anemia   . Fibroid uterus   . Hypertension   . Tobacco abuse     Patient Active Problem List   Diagnosis Date Noted  . Vaginal bleeding 02/23/2017  . Fibroid uterus   . Hypertension   . Tobacco abuse   . Symptomatic anemia 02/07/2016    Past Surgical History:  Procedure Laterality Date  . ENDOMETRIAL ABLATION W/ NOVASURE      OB History    No data available       Home Medications    Prior to Admission medications   Medication Sig Start Date End Date Taking? Authorizing Provider  amLODipine (NORVASC) 5 MG tablet Take 1 tablet (5 mg total) by mouth daily. 02/25/17   Mariel Aloe, MD  ibuprofen (ADVIL,MOTRIN) 200 MG tablet Take 800 mg by mouth every 6 (six) hours as needed for headache, moderate pain or cramping.     [provider]  norgestimate-ethinyl estradiol (ORTHO-CYCLEN,SPRINTEC,PREVIFEM) 0.25-35 MG-MCG tablet Take 1 tablet by mouth daily. Patient not taking: Reported on 02/23/2017 02/08/16   Smiley Houseman, MD    Family History Family History  Problem Relation Age of Onset  . Hypertension Mother   . Stroke Sister     Social History Social  History   Tobacco Use  . Smoking status: Current Every Day Smoker    Packs/day: 0.25    Types: Cigarettes  . Smokeless tobacco: Never Used  Substance Use Topics  . Alcohol use: Yes    Comment: occasional  . Drug use: Yes    Types: Marijuana    Comment: occ     Allergies   Patient has no known allergies.   Review of Systems Review of Systems  Constitutional: Positive for fatigue. Negative for chills and fever.  Respiratory: Positive for shortness of breath.   Cardiovascular: Positive for palpitations. Negative for chest pain.  Skin: Positive for pallor.  Neurological: Positive for light-headedness. Negative for syncope.  All other systems reviewed and are negative.    Physical Exam Updated Vital Signs BP 122/60   Pulse (!) 111   Temp 98.9 F (37.2 C) (Oral)   Resp 20   Ht 4\' 11"  (1.499 m)   SpO2 100%   BMI 33.48 kg/m   Physical Exam  Constitutional: She appears well-developed and well-nourished. No distress.  HENT:  Head: Normocephalic and atraumatic.  Eyes: Conjunctivae are normal. Right eye exhibits no discharge. Left eye exhibits no discharge.  Neck: No JVD present. No tracheal deviation present.  Cardiovascular: Regular rhythm.  Tachycardic  Pulmonary/Chest: Effort normal and breath sounds normal.  Abdominal: Soft. Bowel sounds are normal. She exhibits no distension.  There is no tenderness. There is no guarding.  Musculoskeletal: She exhibits no edema.  Neurological: She is alert.  Skin: Skin is warm and dry. No erythema. There is pallor.  Palpebral and sublingual conjunctival pallor noted  Psychiatric: She has a normal mood and affect. Her behavior is normal.  Nursing note and vitals reviewed.    ED Treatments / Results  Labs (all labs ordered are listed, but only abnormal results are displayed) Labs Reviewed  COMPREHENSIVE METABOLIC PANEL - Abnormal; Notable for the following components:      Result Value   CO2 20 (*)    Glucose, Bld 107 (*)      BUN <5 (*)    Calcium 8.8 (*)    Albumin 3.4 (*)    ALT 11 (*)    All other components within normal limits  CBC WITH DIFFERENTIAL/PLATELET - Abnormal; Notable for the following components:   RBC 2.85 (*)    Hemoglobin 5.1 (*)    HCT 19.1 (*)    MCV 67.0 (*)    MCH 17.9 (*)    MCHC 26.7 (*)    RDW 29.2 (*)    Platelets 94 (*)    All other components within normal limits  I-STAT BETA HCG BLOOD, ED ([facility], [facility], [facility] ONLY)  PREPARE RBC (CROSSMATCH)  TYPE AND SCREEN    EKG  EKG Interpretation None       Radiology No results found.  Procedures Procedures (including critical care time)  Medications Ordered in ED Medications  0.9 %  sodium chloride infusion (not administered)  0.9 %  sodium chloride infusion (10 mL/hr Intravenous New Bag/Given 01/27/18 1332)     Initial Impression / Assessment and Plan / ED Course  I have reviewed the triage vital signs and the nursing notes.  Pertinent labs & imaging results that were available during my care of the patient were reviewed by me and considered in my medical decision making (see chart for details).     Patient presence with symptomatic anemia.  Tachycardic consistently while in the ED.  Vital signs are otherwise stable.  She is pale but nontoxic in appearance.  Hemoglobin of 5.1 today.  Blood transfusion initiated.  She has required transfusions twice in the past for her heavy periods. 2:26 PM Spoke with Dr. Levonne Lapping with internal medicine teaching service who agrees to assume care of patient and bring her into the hospital for further evaluation and management of her symptom medic anemia.  CRITICAL CARE Performed by: Renita Papa   Total critical care time: 35 minutes  Critical care time was exclusive of separately billable procedures and treating other patients.  Critical care was necessary to treat or prevent imminent or life-threatening deterioration.  Critical care was time spent personally by me on the following  activities: development of treatment plan with patient and/or surrogate as well as nursing, discussions with consultants, evaluation of patient's response to treatment, examination of patient, obtaining history from patient or surrogate, ordering and performing treatments and interventions, ordering and review of laboratory studies, ordering and review of radiographic studies, pulse oximetry and re-evaluation of patient's condition.   Final Clinical Impressions(s) / ED Diagnoses   Final diagnoses:  Symptomatic anemia    ED Discharge Orders    None       Debroah Baller 01/27/18 1432    Nat Christen, MD 01/28/18 1109

## 2018-01-27 NOTE — H&P (Signed)
Date: 01/27/2018               Patient Name:  Cynthia Horn MRN: [id number]  DOB: 04/29/1981 Age / Sex: 37 y.o., female   PCP: Nolene Ebbs, MD              Medical Service: Internal Medicine Teaching Service              Attending Physician: Dr. Lucious Groves, DO    First Contact: Linwood Dibbles, MS Pager: [phone]  Second Contact: Dr. Thomasene Ripple Pager: [phone]  Third Contact Dr. Alphonzo Grieve Pager: [phone]       After Hours (After 5p/  First Contact Pager: [phone]  weekends / holidays): Second Contact Pager: [phone]   Chief Complaint: Symptomatic anemia  History of Present Illness: Cynthia Horn is a 37 yo with a significant PMH of sickle cell trait, uterine fibroids, irregular bleeding, hypertension and anemia who presents for an evaluation of fatigue, headache and SOB. She reports heavy menstrual bleeding for the last month, stating she goes through a 24 pack of tampons in a day. She also reports passing clots, but denies any vaginal discharge. She has had irregular menstrual periods since she had a son 17 years ago by c-section but . She has tried endometrial ablation and birth control pills to control the bleeding with no success. Last year, she loss part of her medicaid benefits so she has not been able to see her regular OB/GYN. [facility]. She was given depo injection 5 months ago but her cycle is still irregular, lasting anywhere from 3 weeks to a month on and off. She denies any hx of STI. She endorse nausea and abdominal cramps during her periods and some dizziness upon standing, but denies chest pain, abdominal pain, vomiting, diarrhea, painful or frequent urinary, vaginal discharge or leg swelling. Patient reports two previous episodes of blood transfusion for similar complaints. Denies any family hx of bleeding disorders.   LMP started 01/03/2018. Still bleeding.  Meds: Current Facility-Administered Medications    Medication Dose Route Frequency Provider Last Rate Last Dose  . 0.9 %  sodium chloride infusion   Intravenous Continuous Alphonzo Grieve, MD 150 mL/hr at 01/27/18 1456    . ibuprofen (ADVIL,MOTRIN) tablet 400 mg  400 mg Oral Q6H PRN Alphonzo Grieve, MD       Current Outpatient Medications  Medication Sig Dispense Refill  . Cyanocobalamin (VITAMIN B-12 PO) Take 1 tablet by mouth daily.    Marland Kitchen ibuprofen (ADVIL,MOTRIN) 200 MG tablet Take 800 mg by mouth every 6 (six) hours as needed for headache, moderate pain or cramping.     . medroxyPROGESTERone (DEPO-PROVERA) 150 MG/ML injection Inject 150 mg into the muscle every 3 (three) months.    Marland Kitchen amLODipine (NORVASC) 5 MG tablet Take 1 tablet (5 mg total) by mouth daily. (Patient not taking: Reported on 01/27/2018) 30 tablet 0  . norgestimate-ethinyl estradiol (ORTHO-CYCLEN,SPRINTEC,PREVIFEM) 0.25-35 MG-MCG tablet Take 1 tablet by mouth daily. (Patient not taking: Reported on 02/23/2017) 1 Package 11    Allergies: Allergies as of 01/27/2018  . (No Known Allergies)   Past Medical History:  Diagnosis Date  . Anemia   . Fibroid uterus   . Hypertension   . Tobacco abuse    Past Surgical History:  Procedure Laterality Date  . ENDOMETRIAL ABLATION W/ NOVASURE     Family History  Problem Relation Age of Onset  . Hypertension Mother   .  Stroke Sister    Social History   Socioeconomic History  . Marital status: Single    Spouse name: Not on file  . Number of children: 1  . Years of education: Not on file  . Highest education level: Not on file  Social Needs  . Financial resource strain: Not on file  . Food insecurity - worry: Not on file  . Food insecurity - inability: Not on file  . Transportation needs - medical: Not on file  . Transportation needs - non-medical: Not on file  Occupational History  . Occupation: Midwife  Tobacco Use  . Smoking status: Former Smoker    Packs/day: 0.25    Types: Cigarettes    Last attempt to  quit: 01/25/2018  . Smokeless tobacco: Never Used  Substance and Sexual Activity  . Alcohol use: Yes    Alcohol/week: 0.6 - 1.2 oz    Types: 1 - 2 Glasses of wine per week    Comment: occasional  . Drug use: Yes    Types: Marijuana    Comment: occ  . Sexual activity: Not on file  Other Topics Concern  . Not on file  Social History Narrative   Patient lives at home with her 95 year old son   Works at a call center   Had her medicaid changed last year so she has been unable to see her regular OB/GYN doctor since then. She has been [facility] for her health care needs.       Review of Systems: Constitutional: Positive for fatigue, headache dizziness. Negative for weight loss, loss of appetite, fever, or chills Respiratory: Positive for shortness of breath. Cardiovascular: Positive for palpitations. Negative for chest pain Abdominal: Positive for abdominal cramps, nausea. Negative for diarrhea, vomiting Skin: Positive for pallor: Negative for rashes Urinary: Negative for difficult, frequent urination Gynecological: Positive for bleeding, clots. Negative for vaginal discharge  Physical Exam: Blood pressure 127/76, pulse 89, temperature 98.8 F (37.1 C), temperature source Oral, resp. rate 13, height 4\' 11"  (1.499 m), SpO2 100 %. BP 127/76   Pulse 89   Temp 98.8 F (37.1 C) (Oral)   Resp 13   Ht 4\' 11"  (1.499 m)   SpO2 100%   BMI 33.48 kg/m   General Appearance:    Alert, cooperative, no distress, appears stated age  Head:    Normocephalic, without obvious abnormality, atraumatic  Eyes:    PERRL, conjunctiva/corneas pallor  Lungs:     Clear to auscultation bilaterally, respirations unlabored  Chest Wall:    No tenderness or deformity   Heart:    Regular rate and rhythm, S1 and S2 normal, I/IV murmur best heard on right upper sternal border, no rub or gallop  Abdomen:     Soft, non-tender, bowel sounds active all four quadrants,    no masses, no  organomegaly  Extremities:   Extremities normal, atraumatic, no cyanosis or edema, capillary refill <3 sec  Pulses:   2+ and symmetric all extremities  Skin:   Skin pallor, texture, turgor normal, no rashes or lesions  Neurologic:   CNII-XII grossly intact.    Lab results: @LABTEST @  Imaging results:  No results found.  Other results: EKG: normal EKG, normal sinus rhythm, unchanged from previous tracings.  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 On admission, Patient was found to be tachycardic with a Hgb of 5.1 so patient is being transfused with 2 units of RBC. She was admitted to the inpatient unit and the specific problems addressed are below:  Microcytic Anemia: Patient presented with symptoms of SOB, headache and fatigue. On admission, Hgb of 5.1, Hct 19.1, RBC 2.85 with MCV of 67 all consistent with iron deficiency anemia 2/2 to blood loss. Last Hgb 11 mo ago was 7.6. Baseline around 9. Patient reports she has a history of anemia and has been transfused once a year for the past 2 years. She is prescribed iron but reports they make her short of breath and make her muscles ache. Has been bleeding since the start of periods on 2/12 and reports using a 24-pack of tampons a day.  -Continue transfusion -F/u CBC to check Hgb trends  -Check ferritin in the AM -Plan to discuss daily iron supplementation   HTN: Patient  Has a history of HTN. Prescribed amlodipine in the past, but not currently on, stating BP under control due to diet and lifestyle modifications.  -Monitor BP   FEN/GI: -Heart healthy diet -IV NS @ 150 ml/hr  Code: Full  Dispo: Admit for observation with anticipated discharge tomorrow if Hgb is above 7.   This is a Careers information officer Note.  The care  of the patient was discussed with Dr. Heber [place] and the assessment and plan was formulated with their assistance.  Please see their note for official documentation of the patient encounter.   Signed: Lacinda Axon, Medical Student 01/27/2018, 4:57 PM

## 2018-01-27 NOTE — ED Triage Notes (Signed)
Pt to ER for evaluation of weakness and fatigue, states hx of anemia, states hx of blood transfusions, states has been on her menstrual cycle for 2 months and feels like her hemoglobin is low. Does not have an OBGYN.

## 2018-01-27 NOTE — Progress Notes (Signed)
Admission note:  Arrival Method: Patient arrived in w/c from ED. Mental Orientation: Alert and oriented x 4. Telemetry: 68M-14, NSR, CCMD notified. Assessment: See doc flow sheets. Skin: Warm, dry and intact, no open areas noted. IV: R AC and L FA. Pain: Denies any pain. Tubes: N/A Safety Measures: Bed in low position, call bell and phone within reach. Fall Prevention Safety Plan: Reviewed the plan, verbalized understanding. Admission Screening: Completed 6700 Orientation: Patient has been oriented to the unit, staff and to the room.

## 2018-01-27 NOTE — H&P (Addendum)
Date: 01/27/2018               Patient Name:  Cynthia Horn MRN: [id number]  DOB: [birth year] Age / Sex: 37 y.o., female   PCP: Alden Hipp, MD         Medical Service: Internal Medicine Teaching Service         Attending Physician: Dr. Lucious Groves, DO    First Contact: Dr. Thomasene Ripple Pager: [phone]  Second Contact: Dr. Alphonzo Grieve Pager: [phone]       After Hours (After 5p/  First Contact Pager: [phone]  weekends / holidays): Second Contact Pager: [phone]   Chief Complaint: Fatigue, weakness, and SOB  History of Present Illness:  Tamella Tuccillo is a 37 yo with PMH of iron deficiency anemia, uterine fibroids, and HTN who presents for evaluation of progressive weakness, fatigue, and SOB with exertion. Patient states that she first noticed this constellation of symptoms about 1-2 months prior to admission. Since she first noticed them they have become progressively worse to the point where she can no longer walk across the room without feeling week. Patient states that her family members have noticed that she looks more pale and out of breath while walking around. Patient has experienced this constellation of symptoms before. These occurred about 1 year ago when she needed to have a transfusion. Patient tried to "stay away from the hospital" saying that she didn't want a transfusion this year, however, her constellation of symptoms became overwhelming that she went to ED for evaluation. She states she needs transfusions because of low iron and chronic bleeding 2/2 uterine fibroids. Bleeding with fibroids has gotten worse over the past 3 years. Patient states that her periods last 1-2 weeks (sometimes longer), occur at least every month, and are very heavy. She reports passing clots and needing to use both tampons and pads every day. She goes through about 1 box of tampons and pads per day (at least 18 of each) while on her period. She was previously prescribed oral contraception  to help with the bleeding but lost insurance coverage for the physician who prescribed that medication. She tried taking a Depo-Provera shot in November 2018 but notes that her period increased in frequency after taking this shot and she was discouraged it was explained that her periods would stop on this medication. Date of her last menstrual cycle starting was February 12, patient still currently bleeding.   Patient denies abdominal pain, diaphoresis, nausea/vomiting, lower extremity swelling, and change in bowel and bladder habits.  Upon arrival to the ED patient was afebrile, tachycardic to 118, hypotensive (100/60), and stable on room air. Her CMP showed Cr of 0.68, T Bili of 0.4. Her CBC was significant for Hgb of 5.1. Given this a type and screen was ordered and 2 units of pRBCs were given. IMTS was called for admission.   Meds:  Current Meds  Medication Sig  . Cyanocobalamin (VITAMIN B-12 PO) Take 1 tablet by mouth daily.  Marland Kitchen ibuprofen (ADVIL,MOTRIN) 200 MG tablet Take 800 mg by mouth every 6 (six) hours as needed for headache, moderate pain or cramping.   . medroxyPROGESTERone (DEPO-PROVERA) 150 MG/ML injection Inject 150 mg into the muscle every 3 (three) months.   Allergies: Allergies as of 01/27/2018  . (No Known Allergies)   Past Medical History: Past Medical History:  Diagnosis Date  . Anemia   . Fibroid uterus   . Hypertension   . Tobacco abuse  Past Surgical History: Past Surgical History:  Procedure Laterality Date  . ENDOMETRIAL ABLATION W/ NOVASURE     Family History:  Family History  Problem Relation Age of Onset  . Hypertension Mother   . Stroke Sister    Social History:  Lives with 74 yo son. Works at a call center. Eats diet of leafy greens mostly, some protein, stays away from red meat. Patient quit smoking 2 days prior to admission. Previously smoked 6 cigarettes a day. Social drinker. No illicit drugs.   Review of Systems: A complete ROS was negative  except as per HPI.   Physical Exam: Blood pressure 121/74, pulse (!) 101, temperature 98.5 F (36.9 C), temperature source Oral, resp. rate 18, height 4\' 11"  (1.499 m), SpO2 100 %.  Physical Exam  Constitutional: She appears well-developed and well-nourished. No distress.  HENT:  Mouth/Throat: Oropharynx is clear and moist. No oropharyngeal exudate.  Eyes: Right eye exhibits no discharge. Left eye exhibits no discharge. No scleral icterus.  Pale conjunctiva  Cardiovascular: Normal rate, regular rhythm and intact distal pulses. Exam reveals no friction rub.  No murmur heard. Respiratory: Effort normal. No respiratory distress. She has no wheezes. She has no rales.  GI: Soft. Bowel sounds are normal. She exhibits no distension. There is no tenderness. There is no rebound.  Musculoskeletal: She exhibits no edema (of bilateral lower extremities) or tenderness (of bilateral lower extremities).  Lymphadenopathy:    She has no cervical adenopathy.  Skin: Skin is warm and dry. No rash noted. No erythema.  Pallor of nails beds bilaterally.   Assessment & Plan by Problem: Active Problems:   Symptomatic anemia  Vanice Rappa is a 37 yo with PMH of iron deficiency anemia, uterine fibroids, and HTN who presented with symptomatic anemia and a Hgb of 5.1. She was ordered two units of pRBC's and admitted to the internal medicine teaching service for management. The specific problems addressed during admission are as follows:  Symptomatic microcytic anemia: Patient reported progressive symptoms of fatigue, weakness, SOB, and intermittent chest pain which are consistent with anemia. Patient's labs on admission did not show elevated T. Bili to suggest hemolysis. Patient's MCV on admission was 67, consistent with previous diagnosis of iron deficiency anemia. Unfortunately the patient had already received blood so complete iron panel was not be ordered, however will plan to obtain ferritin tomorrow AM to  assess current iron stores (last observed = 4 in 2017, patient not on oral iron replacement).  -Med-Surg -Telemetry while inpatient -Follow up post-transfusion H/H -Ferritin tomorrow AM -Discuss daily iron supplementation with patient tomorrow AM  HTN: Patient was previously prescribed amlodipine for HTN, but stopped taking it. Working on diet and lifestyle modifications to help with long term BP control. Patient presented with hypotension, currently normotensive. Will continue to clinically monitor -Continue to monitor -Diet and lifestyle modifications encouraged  FEN/GI: -Regular diet -NS @ rate of 150 ml/hr  VTE Prophylaxis: SCD's, ambulate as tolerated Code Status: Full  Dispo: Admit patient to Observation with expected length of stay less than 2 midnights.  SignedThomasene Ripple, MD 01/27/2018, 2:59 PM  Pager: [phone]

## 2018-01-28 ENCOUNTER — Encounter (HOSPITAL_COMMUNITY): Payer: Self-pay | Admitting: Internal Medicine

## 2018-01-28 DIAGNOSIS — D509 Iron deficiency anemia, unspecified: Secondary | ICD-10-CM

## 2018-01-28 DIAGNOSIS — N920 Excessive and frequent menstruation with regular cycle: Secondary | ICD-10-CM | POA: Diagnosis present

## 2018-01-28 LAB — CBC
HEMATOCRIT: 23 % — AB (ref 36.0–46.0)
Hemoglobin: 6.8 g/dL — CL (ref 12.0–15.0)
MCH: 21.1 pg — ABNORMAL LOW (ref 26.0–34.0)
MCHC: 29.6 g/dL — AB (ref 30.0–36.0)
MCV: 71.4 fL — ABNORMAL LOW (ref 78.0–100.0)
Platelets: 83 10*3/uL — ABNORMAL LOW (ref 150–400)
RBC: 3.22 MIL/uL — ABNORMAL LOW (ref 3.87–5.11)
RDW: 27.8 % — AB (ref 11.5–15.5)
WBC: 9.6 10*3/uL (ref 4.0–10.5)

## 2018-01-28 LAB — HEMOGLOBIN AND HEMATOCRIT, BLOOD
HCT: 28.8 % — ABNORMAL LOW (ref 36.0–46.0)
Hemoglobin: 8.5 g/dL — ABNORMAL LOW (ref 12.0–15.0)

## 2018-01-28 LAB — PREPARE RBC (CROSSMATCH)

## 2018-01-28 LAB — FERRITIN: FERRITIN: 5 ng/mL — AB (ref 11–307)

## 2018-01-28 MED ORDER — SODIUM CHLORIDE 0.9 % IV SOLN
Freq: Once | INTRAVENOUS | Status: AC
  Administered 2018-01-28: 06:00:00 via INTRAVENOUS

## 2018-01-28 MED ORDER — FERROUS GLUCONATE 324 (38 FE) MG PO TABS: 324.0000 mg | ORAL_TABLET | Freq: Every day | ORAL | 3 refills | Status: AC

## 2018-01-28 MED ORDER — SODIUM CHLORIDE 0.9 % IV SOLN
510.0000 mg | Freq: Once | INTRAVENOUS | Status: AC
  Administered 2018-01-28: 510 mg via INTRAVENOUS
  Filled 2018-01-28: qty 17

## 2018-01-28 NOTE — Discharge Instructions (Signed)
Iron-Rich Diet Iron is a mineral that helps your body to produce hemoglobin. Hemoglobin is a protein in your red blood cells that carries oxygen to your body's tissues. Eating too little iron may cause you to feel weak and tired, and it can increase your risk for infection. Eating enough iron is necessary for your body's metabolism, muscle function, and nervous system. Iron is naturally found in many foods. It can also be added to foods or fortified in foods. There are two types of dietary iron:  Heme iron. Heme iron is absorbed by the body more easily than nonheme iron. Heme iron is found in meat, poultry, and fish.  Nonheme iron. Nonheme iron is found in dietary supplements, iron-fortified grains, beans, and vegetables.  You may need to follow an iron-rich diet if:  You have been diagnosed with iron deficiency or iron-deficiency anemia.  You have a condition that prevents you from absorbing dietary iron, such as: ? Infection in your intestines. ? Celiac disease. This involves long-lasting (chronic) inflammation of your intestines.  You do not eat enough iron.  You eat a diet that is high in foods that impair iron absorption.  You have lost a lot of blood.  You have heavy bleeding during your menstrual cycle.  You are pregnant.  What is my plan? Your health care provider may help you to determine how much iron you need per day based on your condition. Generally, when a person consumes sufficient amounts of iron in the diet, the following iron needs are met:  Men. ? 37-61 years old: 11 mg per day. ? 37-18 years old: 8 mg per day.  Women. ? 37-5 years old: 15 mg per day. ? 37-2 years old: 18 mg per day. ? Over 37 years old: 8 mg per day. ? Pregnant women: 27 mg per day. ? Breastfeeding women: 37 mg per day.  What do I need to know about an iron-rich diet?  Eat fresh fruits and vegetables that are high in vitamin C along with foods that are high in iron. This will help  increase the amount of iron that your body absorbs from food, especially with foods containing nonheme iron. Foods that are high in vitamin C include oranges, peppers, tomatoes, and mango.  Take iron supplements only as directed by your health care provider. Overdose of iron can be life-threatening. If you were prescribed iron supplements, take them with orange juice or a vitamin C supplement.  Cook foods in pots and pans that are made from iron.  Eat nonheme iron-containing foods alongside foods that are high in heme iron. This helps to improve your iron absorption.  Certain foods and drinks contain compounds that impair iron absorption. Avoid eating these foods in the same meal as iron-rich foods or with iron supplements. These include: ? Coffee, black tea, and red wine. ? Milk, dairy products, and foods that are high in calcium. ? Beans, soybeans, and peas. ? Whole grains.  When eating foods that contain both nonheme iron and compounds that impair iron absorption, follow these tips to absorb iron better. ? Soak beans overnight before cooking. ? Soak whole grains overnight and drain them before using. ? Ferment flours before baking, such as using yeast in bread dough. What foods can I eat? Grains Iron-fortified breakfast cereal. Iron-fortified whole-wheat bread. Enriched rice. Sprouted grains. Vegetables Spinach. Potatoes with skin. Green peas. Broccoli. Red and green bell peppers. Fermented vegetables. Fruits Prunes. Raisins. Oranges. Strawberries. Mango. Grapefruit. Meats and Other Protein Sources  Beef liver. Oysters. Beef. Shrimp. Turkey. Chicken. Tuna. Sardines. Chickpeas. Nuts. Tofu. °Beverages °Tomato juice. Fresh orange juice. Prune juice. Hibiscus tea. Fortified instant breakfast shakes. °Condiments °Tahini. Fermented soy sauce. °Sweets and Desserts °Black-strap molasses. °Other °Wheat germ. °The items listed above may not be a complete list of recommended foods or beverages.  Contact your dietitian for more options. °What foods are not recommended? °Grains °Whole grains. Bran cereal. Bran flour. Oats. °Vegetables °Artichokes. Brussels sprouts. Kale. °Fruits °Blueberries. Raspberries. Strawberries. Figs. °Meats and Other Protein Sources °Soybeans. Products made from soy protein. °Dairy °Milk. Cream. Cheese. Yogurt. Cottage cheese. °Beverages °Coffee. Black tea. Red wine. °Sweets and Desserts °Cocoa. Chocolate. Ice cream. °Other °Basil. Oregano. Parsley. °The items listed above may not be a complete list of foods and beverages to avoid. Contact your dietitian for more information. °This information is not intended to replace advice given to you by your health care provider. Make sure you discuss any questions you have with your health care provider. °Document Released: 06/22/2005 Document Revised: 05/28/2016 Document Reviewed: 06/05/2014 °Elsevier Interactive Patient Education © 2018 [employer] ° °

## 2018-01-28 NOTE — Progress Notes (Signed)
CRITICAL VALUE ALERT  Critical Value:  Hemoglobin=6.8  Date & Time Notied:  01/28/18  Provider Notified: 01/28/18 @04 :45 Dr. Ronalee Red  Orders Received/Actions taken: Order received in epic,carried out

## 2018-01-28 NOTE — Discharge Summary (Signed)
Name: Cynthia Horn MRN: [id number] DOB: 03-31-1981 37 y.o. PCP: Nolene Ebbs, MD  Date of Admission: 01/27/2018 12:08 PM Date of Discharge: 01/28/2018 Attending Physician: Dr. Joni Reining  Discharge Diagnosis:  Active Problems:   Symptomatic anemia   Menorrhagia   Iron deficiency anemia  Discharge Medications: Allergies as of 01/28/2018   No Known Allergies     Medication List    STOP taking these medications   medroxyPROGESTERone 150 MG/ML injection Commonly known as:  DEPO-PROVERA     TAKE these medications   amLODipine 5 MG tablet Commonly known as:  NORVASC Take 1 tablet (5 mg total) by mouth daily.   ferrous gluconate 324 MG tablet Commonly known as:  FERGON Take 1 tablet (324 mg total) by mouth daily with breakfast.   ibuprofen 200 MG tablet Commonly known as:  ADVIL,MOTRIN Take 800 mg by mouth every 6 (six) hours as needed for headache, moderate pain or cramping.   VITAMIN B-12 PO Take 1 tablet by mouth daily.      Disposition and follow-up:   [name] was discharged from [facility] in Stable condition.  At the hospital follow up visit please address:  1.  Cynthia Horn was admitted for symptomatic iron-deficiency anemia 2/2 chronic menorrhagia as a result of uterine fibroids. Patient received 3 units pRBC's and IV iron during hospitalization with clinical improvement in her symptoms. Patient discharged on oral iron therapy with referral to GYN for further management of menorrhagia. Please assess compliance with oral iron therapy and follow up with GYN regarding her menstrual bleeding.   2.  Labs / imaging needed at time of follow-up: CBC, iron studies  3.  Pending labs/ test needing follow-up: None  Follow-up Appointments: Follow-up Information    Nolene Ebbs, MD. Schedule an appointment as soon as possible for a visit in 1 week(s).   Specialty:  Internal Medicine Why:  Please follow up with your primary care provider  regarding your hospitalization and chronic iron deficiency anemia. Contact information: [place] [address] [address]. Call.   Why:  Please call this clinic to arranage follow up with an GYN. Please indicate that you were hospitalized for anemia and need to establish with a physician to manage heavy menstrual bleeding. Contact information: [place] [id number] [place] [facility] Course by problem list: Active Problems:   Symptomatic anemia   Menorrhagia   Iron deficiency anemia   Cynthia Horn is a 37 yo with PMH of iron deficiency anemia, uterine fibroids, and HTN who presented with symptomatic anemia and a Hgb of 5.1. She was ordered two units of pRBC's and admitted to the internal medicine teaching service for management. The specific problems addressed during admission are as follows:  Symptomatic iron-deficiency anemia: The patient initially presented with Hgb 5.1, MCV = 67. She was ordered 2 units pRBC's in the ED prior to transfer to the floor. After this transfusion the patient's Hgb increased to 7.0, but dropped to 6.8 on AM labs. Because of this slight drop, the patient was given another unit of pRBC's, for a total of 3 units during the hospitalization.  Her Hgb after the 3 units of pRBCs was 8.5. Her initial drop was thought to be 2/2 patient's report of continued bleeding from her previous menstrual cycle. Her bleeding was present on admission and overnight, but  had resolved the morning of HD #1. During hospitalization serum ferritin was found to be 5, consistent with profound iron deficiency anemia. As the patient reported a previous history of intolerance to oral iron sulfate, the patient was given a dose of IV fereheme to assist with RBC production in the setting of iron deficiency anemia. During hospitalization the benefits of daily iron supplementation  and a diet rich in iron was discussed with the patient. She was instructed to start daily iron supplementation upon discharge (iron gluconate prescribed given previous intolerance to iron sulfate). She was instructed to follow up with PCP regarding further management of iron deficiency anemia. She was also given a referral to GYN, as discussed below, for further management of her heavy menstrual bleeding.   Menorrhagia: Patient does not have a GYN to help manage her uterine fibroids, which are thought to be the source of her heavy menstrual bleeding. Patient reports that she previously tried Depo-Provera shot to help with menstrual bleeding, but she states that this made bleeding worse. She was previously controlled on OCP's but states she stopped taking them when she lost insurance. Patient was introduced to the idea of an IUD, which is an affordable option for patient's heavy menstrual bleeding. Patient was discharged with instructions to [facility] (number provided on discharge).  HTN: Patient initially hypotensive to 100s/50s upon arrival to the ED, but became normotensive with administration of IVF and pRBC's overnight. Patient reported history of HTN treated with amlodipine 5 mg daily, which she self-discontinued. She has been managing BP with diet, exercise, and weight loss. Please assess BP upon follow up and determine if she requires further medical management of hypertension.   Discharge Vitals:   BP (!) 149/90   Pulse 92   Temp 98.2 F (36.8 C) (Oral)   Resp 16   Ht 4\' 11"  (1.499 m)   Wt 156 lb 8.4 oz (71 kg)   SpO2 100%   BMI 31.61 kg/m   Pertinent Labs, Studies, and Procedures:  CMP Latest Ref Rng & Units 01/27/2018 02/24/2017 02/23/2017  Glucose 65 - 99 mg/dL 107(H) 75 93  BUN 6 - 20 mg/dL <5(L) <5(L) 8  Creatinine 0.44 - 1.00 mg/dL 0.68 0.63 0.63  Sodium 135 - 145 mmol/L 138 136 137  Potassium 3.5 - 5.1 mmol/L 3.7 3.2(L) 3.5  Chloride 101 - 111  mmol/L 108 111 110  CO2 22 - 32 mmol/L 20(L) 19(L) 21(L)  Calcium 8.9 - 10.3 mg/dL 8.8(L) 8.3(L) 8.6(L)  Total Protein 6.5 - 8.1 g/dL 6.7 - -  Total Bilirubin 0.3 - 1.2 mg/dL 0.4 - -  Alkaline Phos 38 - 126 U/L 81 - -  AST 15 - 41 U/L 23 - -  ALT 14 - 54 U/L 11(L) - -   CBC Latest Ref Rng & Units 01/28/2018 01/28/2018 01/27/2018  WBC 4.0 - 10.5 K/uL - 9.6 9.6  Hemoglobin 12.0 - 15.0 g/dL 8.5(L) 6.8(LL) 7.0(L)  Hematocrit 36.0 - 46.0 % 28.8(L) 23.0(L) 23.3(L)  Platelets 150 - 400 K/uL - 83(L) 80(L)   Ferritin = 5 ng/mL  Discharge Instructions: Discharge Instructions    Call MD for:  difficulty breathing, headache or visual disturbances   Complete by:  As directed    Call MD for:  extreme fatigue   Complete by:  As directed    Call MD for:  hives   Complete by:  As directed    Call MD for:  persistant dizziness or light-headedness  Complete by:  As directed    Call MD for:  persistant nausea and vomiting   Complete by:  As directed    Call MD for:  redness, tenderness, or signs of infection (pain, swelling, redness, odor or green/yellow discharge around incision site)   Complete by:  As directed    Call MD for:  severe uncontrolled pain   Complete by:  As directed    Call MD for:  temperature >100.4   Complete by:  As directed    Diet - low sodium heart healthy   Complete by:  As directed    Discharge instructions   Complete by:  As directed    Please call the [facility] gynecology clinic at the number provided this week. They will help with finding a medicine to control your bleeding. If you have recurrence of your shortness of breath, fatigue, please go to the emergency room to recheck your blood counts, either our emergency room or the [facility] hospital if you are continuing to have bleeding. Please start taking daily iron - I have prescribed a different formulation that will hopefully be better tolerated.   Increase activity slowly   Complete by:  As directed       Signed: Thomasene Ripple, MD 01/28/2018, 4:08 PM   Pager: [phone]

## 2018-01-28 NOTE — Progress Notes (Addendum)
   Subjective:  Patient seen laying comfortably in bed this AM in no acute distress. Patient states she feels better after receiving blood last night, as she has been able to walk around the room without dyspnea and worsening fatigue. Patient states that she feels well enough to go home today.  Objective:  Vital signs in last 24 hours: Vitals:   01/27/18 2045 01/28/18 0454 01/28/18 0603 01/28/18 0630  BP: (!) 150/86 (!) 151/91 134/81 (!) 145/88  Pulse: 89 81 88 87  Resp: 17 18 17 16   Temp: 98.7 F (37.1 C) 98.8 F (37.1 C) 98.3 F (36.8 C) 98.5 F (36.9 C)  TempSrc: Oral Oral Oral Oral  SpO2: 100% 100% 100% 100%  Weight:      Height:       Physical Exam  Constitutional: She appears well-developed and well-nourished. No distress.  HENT:  Mouth/Throat: Oropharynx is clear and moist. No oropharyngeal exudate.  Eyes:  Conjunctival pallor  Cardiovascular: Normal rate, regular rhythm and intact distal pulses. Exam reveals no friction rub.  No murmur heard. Respiratory: Effort normal. No respiratory distress. She has no wheezes. She has no rales.  GI: Soft. She exhibits no distension. There is no tenderness. There is no rebound.  Musculoskeletal: She exhibits no edema (of bilateral lower extremities) or tenderness (of bilateral lower extremities).  Skin: Skin is warm and dry. No rash noted. She is not diaphoretic. No erythema.   Assessment/Plan:  Active Problems:   Symptomatic anemia  Cynthia Horn is a 37 yo with PMH of iron deficiency anemia, uterine fibroids, and HTN who presented with symptomatic anemia and a Hgb of 5.1. She was ordered two units of pRBC's and admitted to the internal medicine teaching service for management. The specific problems addressed during admission are as follows:  Symptomatic microcytic anemia: Patient's Hgb increased to 7.0 immediately after transfusion, but dropped to 6.8 on AM labs. Patient had bleeding from last menstrual cycle on admission, so  gave another unit of pRBC's given this drop. Patient states she is no longer bleeding. This AM her serum ferritin = 5. Discussed daily iron supplementation with patient this AM and she is still reluctant to try this given previous history of side effects, which included muscle aches, pains, and "feeling sick all over". Will give IV replacement given this history of adverse effects from oral replacement -IV feraheme this AM -F/u post transfusion H/H this afternoon -Will recommend daily oral iron supplementation on discharge given profound iron deficiency and ongoing bleeding. Will also provide nutritional information regarding iron-rich foods.  Menorrhagia: Will need to arrange follow up with outpatient GYN to assist with management of menorrhagia 2/2 uterine fibroids since patient does not currently have physician to provide hormone therapy. This follow up will hopefully help decrease bleeding overall, thus improving chronic iron deficiency anemia and preventing future hospitalizations for symptomatic anemia.  -Follow up with [facility], will facilitate this upon discharge  HTN: Current BP = 149/90. -Continue to monitor -Diet and lifestyle modifications, encouraged on admission -PCP follow up for further medication management.  FEN/GI: -Regular diet -No IVF, replace electrolytes PRN  VTE Prophylaxis: SCD's, ambulate as tolerated Code Status: Full  Dispo: Anticipated discharge today pending post-transfusion H/H.  Thomasene Ripple, MD 01/28/2018, 7:03 AM Pager: [phone]

## 2018-01-29 LAB — TYPE AND SCREEN
ABO/RH(D): O POS
Antibody Screen: NEGATIVE
Unit division: 0
Unit division: 0
Unit division: 0

## 2018-01-29 LAB — BPAM RBC
Blood Product Expiration Date: 201903152359
Blood Product Expiration Date: 201903272359
Blood Product Expiration Date: 201904052359
ISSUE DATE / TIME: 201903081427
ISSUE DATE / TIME: 201903081814
ISSUE DATE / TIME: 201903090606
Unit Type and Rh: 5100
Unit Type and Rh: 5100
Unit Type and Rh: 5100

## 2018-03-13 ENCOUNTER — Other Ambulatory Visit: Payer: Self-pay

## 2018-03-13 ENCOUNTER — Emergency Department (HOSPITAL_COMMUNITY)
Admission: EM | Admit: 2018-03-13 | Discharge: 2018-03-14 | Disposition: A | Discharge status: Home / Self Care | Payer: Self-pay | Attending: Emergency Medicine | Admitting: Emergency Medicine

## 2018-03-13 ENCOUNTER — Encounter (HOSPITAL_COMMUNITY): Payer: Self-pay | Admitting: Emergency Medicine

## 2018-03-13 DIAGNOSIS — R102 Pelvic and perineal pain: Secondary | ICD-10-CM | POA: Insufficient documentation

## 2018-03-13 DIAGNOSIS — N939 Abnormal uterine and vaginal bleeding, unspecified: Secondary | ICD-10-CM

## 2018-03-13 DIAGNOSIS — I10 Essential (primary) hypertension: Secondary | ICD-10-CM | POA: Insufficient documentation

## 2018-03-13 DIAGNOSIS — Z79899 Other long term (current) drug therapy: Secondary | ICD-10-CM | POA: Insufficient documentation

## 2018-03-13 DIAGNOSIS — Z87891 Personal history of nicotine dependence: Secondary | ICD-10-CM | POA: Insufficient documentation

## 2018-03-13 DIAGNOSIS — N72 Inflammatory disease of cervix uteri: Secondary | ICD-10-CM

## 2018-03-13 NOTE — ED Triage Notes (Signed)
Pt reports having lower left quadrant pain along with back pain that has been ongoing for the last 2 weeks with passing blood clots. Pt not sure if from bladder of vagina.

## 2018-03-14 LAB — CBC
HCT: 33 % — ABNORMAL LOW (ref 36.0–46.0)
Hemoglobin: 10.3 g/dL — ABNORMAL LOW (ref 12.0–15.0)
MCH: 24.2 pg — AB (ref 26.0–34.0)
MCHC: 31.2 g/dL (ref 30.0–36.0)
MCV: 77.5 fL — AB (ref 78.0–100.0)
Platelets: 226 10*3/uL (ref 150–400)
RBC: 4.26 MIL/uL (ref 3.87–5.11)
RDW: 18.3 % — ABNORMAL HIGH (ref 11.5–15.5)
WBC: 8.9 10*3/uL (ref 4.0–10.5)

## 2018-03-14 LAB — URINALYSIS, ROUTINE W REFLEX MICROSCOPIC

## 2018-03-14 LAB — COMPREHENSIVE METABOLIC PANEL
ALT: 11 U/L — ABNORMAL LOW (ref 14–54)
ANION GAP: 9 (ref 5–15)
AST: 14 U/L — ABNORMAL LOW (ref 15–41)
Albumin: 3.4 g/dL — ABNORMAL LOW (ref 3.5–5.0)
Alkaline Phosphatase: 71 U/L (ref 38–126)
BILIRUBIN TOTAL: 0.2 mg/dL — AB (ref 0.3–1.2)
BUN: 8 mg/dL (ref 6–20)
CALCIUM: 8.8 mg/dL — AB (ref 8.9–10.3)
CO2: 19 mmol/L — AB (ref 22–32)
Chloride: 109 mmol/L (ref 101–111)
Creatinine, Ser: 0.8 mg/dL (ref 0.44–1.00)
Glucose, Bld: 103 mg/dL — ABNORMAL HIGH (ref 65–99)
Potassium: 3.4 mmol/L — ABNORMAL LOW (ref 3.5–5.1)
SODIUM: 137 mmol/L (ref 135–145)
TOTAL PROTEIN: 6.5 g/dL (ref 6.5–8.1)

## 2018-03-14 LAB — URINALYSIS, MICROSCOPIC (REFLEX): WBC UA: NONE SEEN WBC/hpf (ref 0–5)

## 2018-03-14 LAB — GC/CHLAMYDIA PROBE AMP (~~LOC~~) NOT AT ARMC
Chlamydia: NEGATIVE
Neisseria Gonorrhea: NEGATIVE

## 2018-03-14 LAB — WET PREP, GENITAL
Sperm: NONE SEEN
TRICH WET PREP: NONE SEEN
YEAST WET PREP: NONE SEEN

## 2018-03-14 LAB — I-STAT BETA HCG BLOOD, ED (MC, WL, AP ONLY): I-stat hCG, quantitative: <5 m[IU]/mL (ref <5)

## 2018-03-14 LAB — LIPASE, BLOOD: LIPASE: 44 U/L (ref 11–51)

## 2018-03-14 MED ORDER — ACETAMINOPHEN ER 650 MG PO TBCR: 650.0000 mg | EXTENDED_RELEASE_TABLET | Freq: Three times a day (TID) | ORAL | 0 refills | Status: AC | PRN

## 2018-03-14 MED ORDER — DOXYCYCLINE HYCLATE 100 MG PO CAPS
100.0000 mg | ORAL_CAPSULE | Freq: Two times a day (BID) | ORAL | 0 refills | Status: AC
Start: 2018-03-14 — End: ?

## 2018-03-14 MED ORDER — CEFTRIAXONE SODIUM 250 MG IJ SOLR
250.0000 mg | Freq: Once | INTRAMUSCULAR | Status: AC
  Administered 2018-03-14: 250 mg via INTRAMUSCULAR
  Filled 2018-03-14: qty 250

## 2018-03-14 MED ORDER — AZITHROMYCIN 250 MG PO TABS
1000.0000 mg | ORAL_TABLET | Freq: Once | ORAL | Status: AC
  Administered 2018-03-14: 1000 mg via ORAL
  Filled 2018-03-14: qty 4

## 2018-03-14 MED ORDER — IBUPROFEN 600 MG PO TABS: 600.0000 mg | ORAL_TABLET | Freq: Four times a day (QID) | ORAL | 0 refills | Status: AC | PRN

## 2018-03-14 MED ORDER — LIDOCAINE HCL 1 % IJ SOLN
INTRAMUSCULAR | Status: AC
  Filled 2018-03-14: qty 20

## 2018-03-14 NOTE — ED Provider Notes (Addendum)
[facility] DEPT Provider Note   CSN: [id number] Arrival date & time: 03/13/18  2234     History   Chief Complaint Chief Complaint  Patient presents with  . Abdominal Pain  . Vaginal Bleeding    HPI Cynthia Horn is a 37 y.o. female.  HPI 37 year old female comes in with chief complaint of vaginal bleeding and abdominal pain.  Patient has been having symptoms for the past 2 weeks.  Patient's abdominal discomfort is located in the lower quadrants, and it is intermittent, lasting for about an hour.  Patient is also having vaginal bleeding now and she is passing clots.  As far she knows she is not pregnant.  Patient denies any pain with urination, frequency, vaginal discharge.  Patient is having intercourse with one partner only, however she is uncertain if the partner is sleeping with anyone else.  Patient has history of fibroids, but her current pain is different.  Past Medical History:  Diagnosis Date  . Anemia   . Fibroid uterus   . Hypertension   . Tobacco abuse   . Vaginal bleeding 02/23/2017    Patient Active Problem List   Diagnosis Date Noted  . Menorrhagia 01/28/2018  . Iron deficiency anemia 01/28/2018  . Fibroid uterus   . Hypertension   . Tobacco abuse   . Symptomatic anemia 02/07/2016    Past Surgical History:  Procedure Laterality Date  . ENDOMETRIAL ABLATION W/ NOVASURE       OB History    Gravida  1   Para  1   Term  1   Preterm      AB      Living  1     SAB      TAB      Ectopic      Multiple      Live Births  1            Home Medications    Prior to Admission medications   Medication Sig Start Date End Date Taking? Authorizing Provider  ferrous gluconate (FERGON) 324 MG tablet Take 1 tablet (324 mg total) by mouth daily with breakfast. 01/28/18  Yes Alphonzo Grieve, MD  acetaminophen (TYLENOL 8 HOUR) 650 MG CR tablet Take 1 tablet (650 mg total) by mouth every 8 (eight) hours as needed.  03/14/18   Varney Biles, MD  amLODipine (NORVASC) 5 MG tablet Take 1 tablet (5 mg total) by mouth daily. Patient not taking: Reported on 01/27/2018 02/25/17   Mariel Aloe, MD  doxycycline (VIBRAMYCIN) 100 MG capsule Take 1 capsule (100 mg total) by mouth 2 (two) times daily. 03/14/18   Varney Biles, MD  ibuprofen (ADVIL,MOTRIN) 600 MG tablet Take 1 tablet (600 mg total) by mouth every 6 (six) hours as needed. 03/14/18   Varney Biles, MD    Family History Family History  Problem Relation Age of Onset  . Hypertension Mother   . Stroke Sister     Social History Social History   Tobacco Use  . Smoking status: Former Smoker    Packs/day: 0.25    Types: Cigarettes    Last attempt to quit: 01/25/2018    Years since quitting: 0.1  . Smokeless tobacco: Never Used  Substance Use Topics  . Alcohol use: Yes    Alcohol/week: 0.6 - 1.2 oz    Types: 1 - 2 Glasses of wine per week    Comment: occasional  . Drug use: Yes    Types: Marijuana  Comment: occ     Allergies   Patient has no known allergies.   Review of Systems Review of Systems  Constitutional: Positive for activity change.  Respiratory: Negative for shortness of breath.   Cardiovascular: Negative for chest pain.  Gastrointestinal: Positive for abdominal pain. Negative for nausea and vomiting.  Genitourinary: Positive for vaginal bleeding. Negative for dysuria, flank pain and vaginal discharge.  Neurological: Negative for light-headedness.  All other systems reviewed and are negative.    Physical Exam Updated Vital Signs BP (!) 155/103 Comment: informed the nurse of b/p  Pulse 70   Temp 98.6 F (37 C) (Oral)   Resp 19   Ht 4\' 11"  (1.499 m)   Wt 68 kg (150 lb)   LMP 02/27/2018   SpO2 100%   BMI 30.30 kg/m   Physical Exam  Constitutional: She is oriented to person, place, and time. She appears well-developed.  HENT:  Head: Normocephalic and atraumatic.  Eyes: Pupils are equal, round, and reactive to  light. Conjunctivae and EOM are normal.  Neck: Normal range of motion. Neck supple.  Cardiovascular: Normal rate, regular rhythm, normal heart sounds and intact distal pulses.  No murmur heard. Pulmonary/Chest: Effort normal. No respiratory distress. She has no wheezes.  Abdominal: Soft. Bowel sounds are normal. She exhibits no distension. There is tenderness in the right lower quadrant, suprapubic area and left lower quadrant. There is no rebound and no guarding.  Genitourinary: Uterus normal. There is bleeding in the vagina.  Genitourinary Comments: External exam - normal, no lesions Speculum exam: Pt has no discharge, + blood Bimanual exam: Patient has CMT, cervical os is closed  Neurological: She is alert and oriented to person, place, and time.  Skin: Skin is warm and dry.  Nursing note and vitals reviewed.    ED Treatments / Results  Labs (all labs ordered are listed, but only abnormal results are displayed) Labs Reviewed  WET PREP, GENITAL - Abnormal; Notable for the following components:      Result Value   Clue Cells Wet Prep HPF POC PRESENT (*)    WBC, Wet Prep HPF POC FEW (*)    All other components within normal limits  URINALYSIS, ROUTINE W REFLEX MICROSCOPIC - Abnormal; Notable for the following components:   Color, Urine RED (*)    APPearance TURBID (*)    Glucose, UA   (*)    Value: TEST NOT REPORTED DUE TO COLOR INTERFERENCE OF URINE PIGMENT   Hgb urine dipstick   (*)    Value: TEST NOT REPORTED DUE TO COLOR INTERFERENCE OF URINE PIGMENT   Bilirubin Urine   (*)    Value: TEST NOT REPORTED DUE TO COLOR INTERFERENCE OF URINE PIGMENT   Ketones, ur   (*)    Value: TEST NOT REPORTED DUE TO COLOR INTERFERENCE OF URINE PIGMENT   Protein, ur   (*)    Value: TEST NOT REPORTED DUE TO COLOR INTERFERENCE OF URINE PIGMENT   Nitrite   (*)    Value: TEST NOT REPORTED DUE TO COLOR INTERFERENCE OF URINE PIGMENT   Leukocytes, UA   (*)    Value: TEST NOT REPORTED DUE TO COLOR  INTERFERENCE OF URINE PIGMENT   All other components within normal limits  CBC - Abnormal; Notable for the following components:   Hemoglobin 10.3 (*)    HCT 33.0 (*)    MCV 77.5 (*)    MCH 24.2 (*)    RDW 18.3 (*)    All  other components within normal limits  COMPREHENSIVE METABOLIC PANEL - Abnormal; Notable for the following components:   Potassium 3.4 (*)    CO2 19 (*)    Glucose, Bld 103 (*)    Calcium 8.8 (*)    Albumin 3.4 (*)    AST 14 (*)    ALT 11 (*)    Total Bilirubin 0.2 (*)    All other components within normal limits  URINALYSIS, MICROSCOPIC (REFLEX) - Abnormal; Notable for the following components:   Bacteria, UA RARE (*)    All other components within normal limits  LIPASE, BLOOD  I-STAT BETA HCG BLOOD, ED ([facility], [facility], [facility] ONLY)  GC/CHLAMYDIA PROBE AMP ([facility]) NOT AT [facility]    EKG None  Radiology No results found.  Procedures Procedures (including critical care time)  Medications Ordered in ED Medications  lidocaine (XYLOCAINE) 1 % (with pres) injection (has no administration in time range)  azithromycin (ZITHROMAX) tablet 1,000 mg (1,000 mg Oral Given 03/14/18 0319)  cefTRIAXone (ROCEPHIN) injection 250 mg (250 mg Intramuscular Given 03/14/18 0319)     Initial Impression / Assessment and Plan / ED Course  I have reviewed the triage vital signs and the nursing notes.  Pertinent labs & imaging results that were available during my care of the patient were reviewed by me and considered in my medical decision making (see chart for details).     37 year old female comes in a chief complaint of pelvic pain and vaginal bleeding.  DDx includes: Ectopic pregnancy Miscarriage Spontaneous abortion Trauma/cerval laceration PID STD/cervicitis Fibroids Ovarian cyst  37 year old comes in with chief complaint of vaginal bleeding.  Patient is having lower quadrant tenderness that is intermittent.  Patient's abdominal exam is not peritoneal.  Pelvic exam  does reveal cervical motion tenderness along with vaginal bleeding.  Patient is engaged in slightly high risk behavior.  She agrees to be treated for [facility] and chlamydia, but is declined HIV screen.  Pregnancy test is negative.  No indication for emergent ultrasound.  Besides cervicitis, concerns are high for fibroid/ovarian cyst related pain.  We will have her follow-up with outpatient OB.   Final Clinical Impressions(s) / ED Diagnoses   Final diagnoses:  Cervicitis  Vaginal bleeding    ED Discharge Orders        Ordered    doxycycline (VIBRAMYCIN) 100 MG capsule  2 times daily     03/14/18 0320    acetaminophen (TYLENOL 8 HOUR) 650 MG CR tablet  Every 8 hours PRN     03/14/18 0320    ibuprofen (ADVIL,MOTRIN) 600 MG tablet  Every 6 hours PRN     03/14/18 0320       Varney Biles, MD 03/14/18 [id number]    Varney Biles, MD 03/14/18 0320

## 2018-03-14 NOTE — Discharge Instructions (Signed)
Please see the gynecologist as soon as possible for the bleeding. Take the medicines prescribed including the antibiotics.

## 2018-04-06 IMAGING — US US ART/VEN ABD/PELV/SCROTUM DOPPLER LTD
1 series · 13 of 25 positions shown · non-contrast
Comparison: None.

CLINICAL DATA: Pelvic pain

EXAM:
TRANSABDOMINAL AND TRANSVAGINAL ULTRASOUND OF PELVIS
DOPPLER ULTRASOUND OF OVARIES
TECHNIQUE: Both transabdominal and transvaginal ultrasound examinations of the
pelvis were performed. Transabdominal technique was performed for
global imaging of the pelvis including uterus, ovaries, adnexal
regions, and pelvic cul-de-sac.
It was necessary to proceed with endovaginal exam following the
transabdominal exam to visualize the endometrium and ovaries. Color
and duplex Doppler ultrasound was utilized to evaluate blood flow to
the ovaries.

[Series 1: us art/ven abd/pelv/scrotum doppler ltd · 0.22mm/px · 13 of 100 slices shown]
[im 1/100]
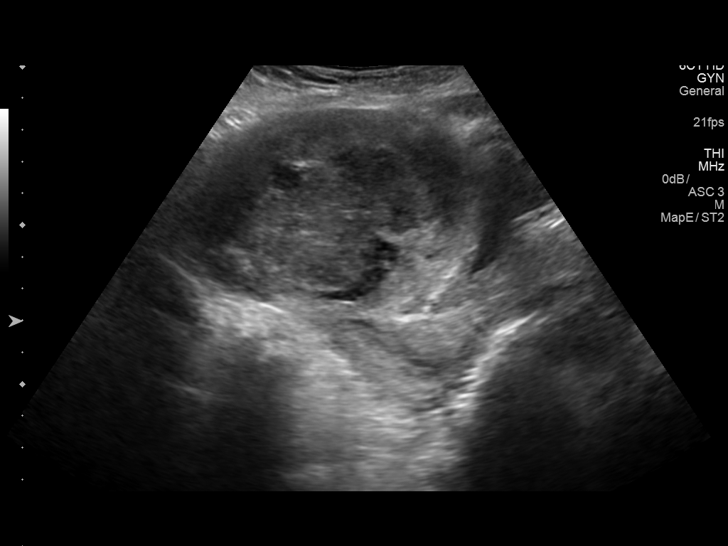
[im 9/100]
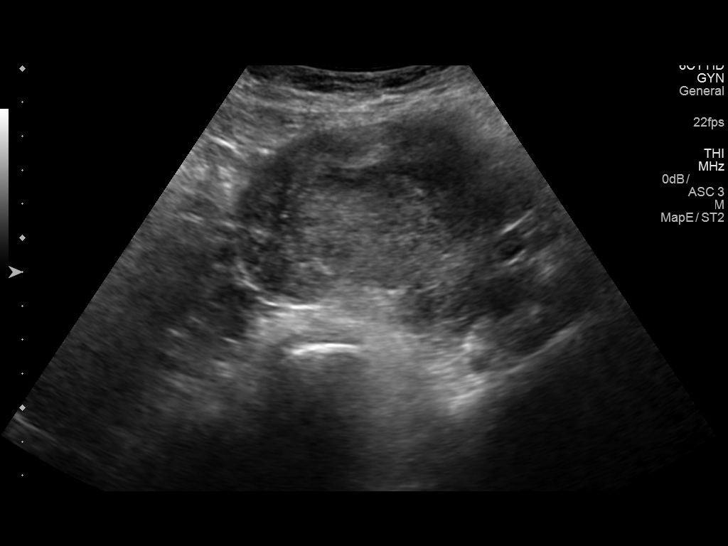
[im 17/100]
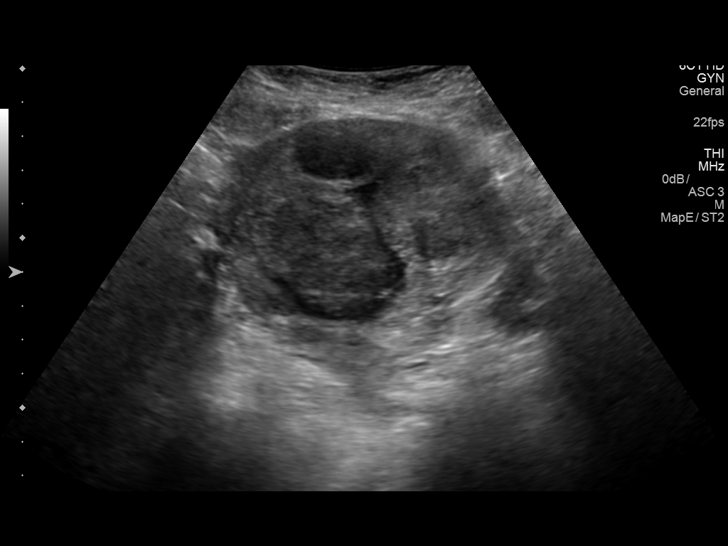
[im 25/100]
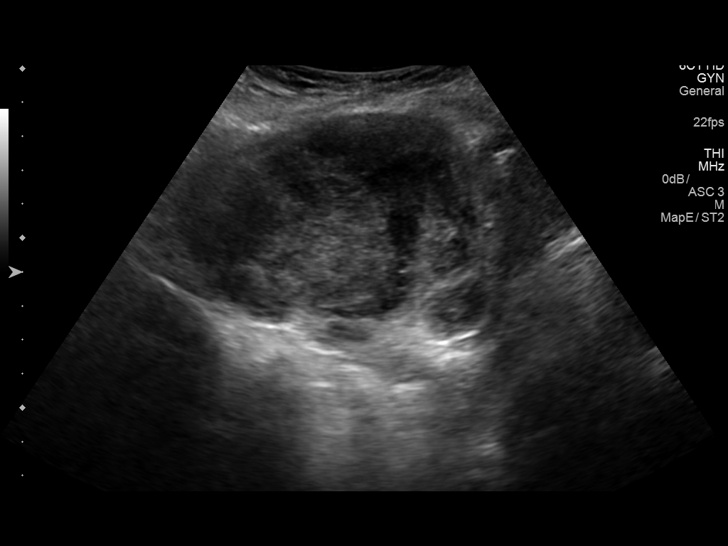
[im 34/100]
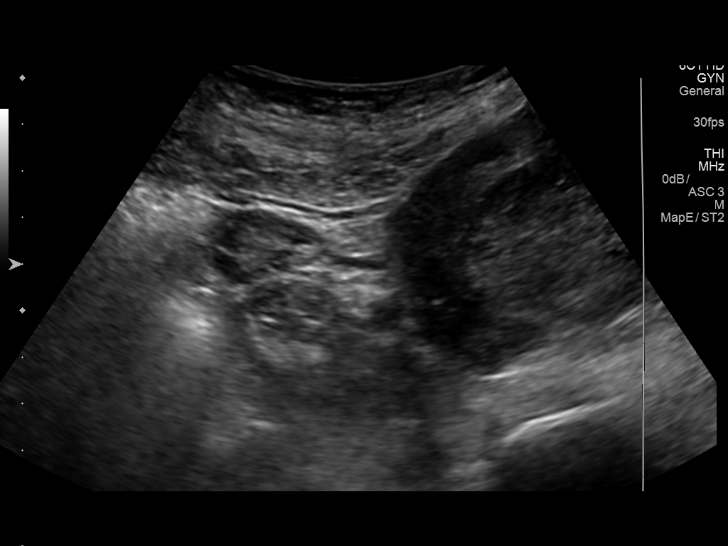
[im 42/100]
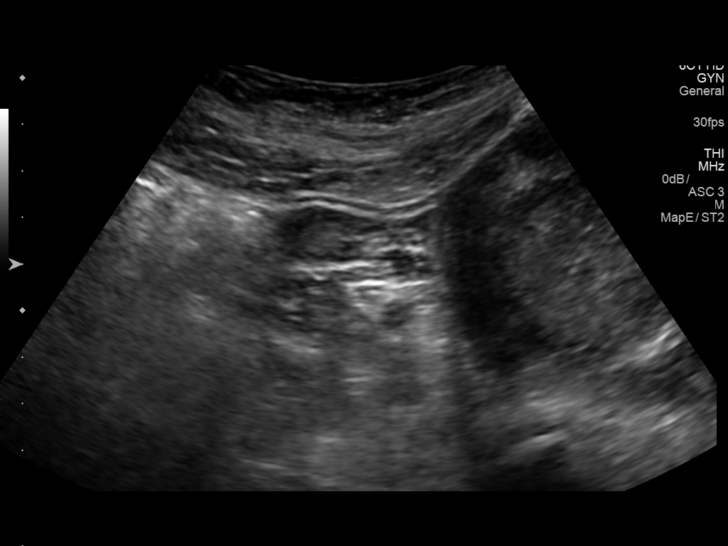
[im 50/100]
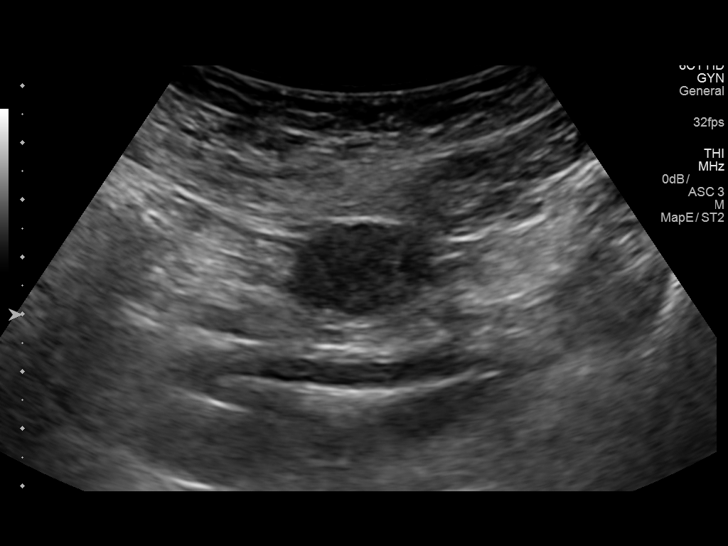
[im 58/100]
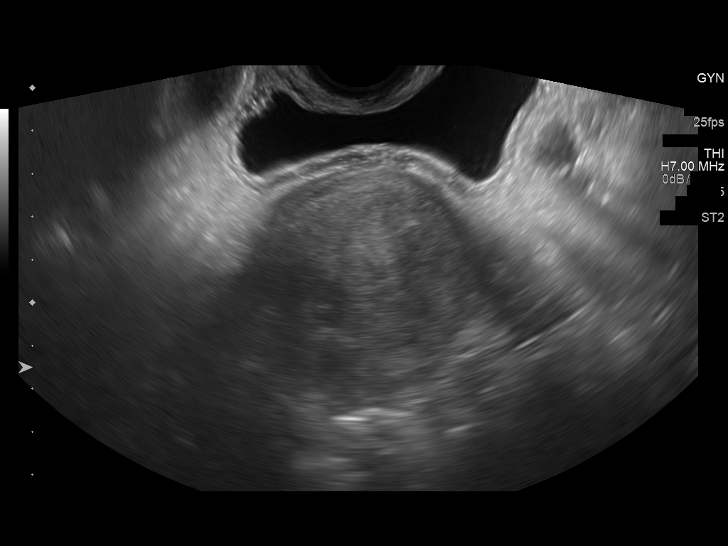
[im 67/100]
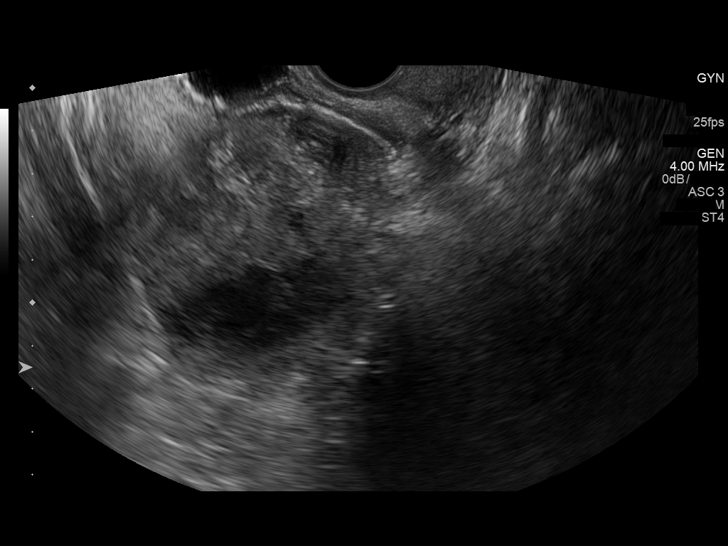
[im 75/100]
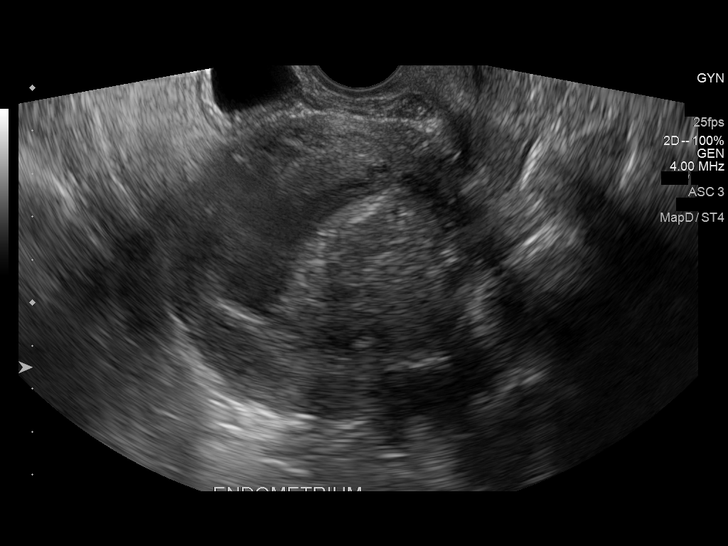
[im 83/100]
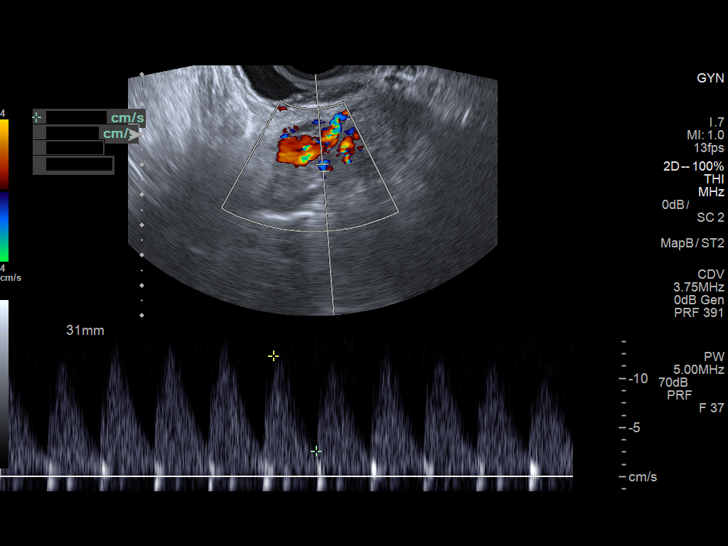
[im 91/100]
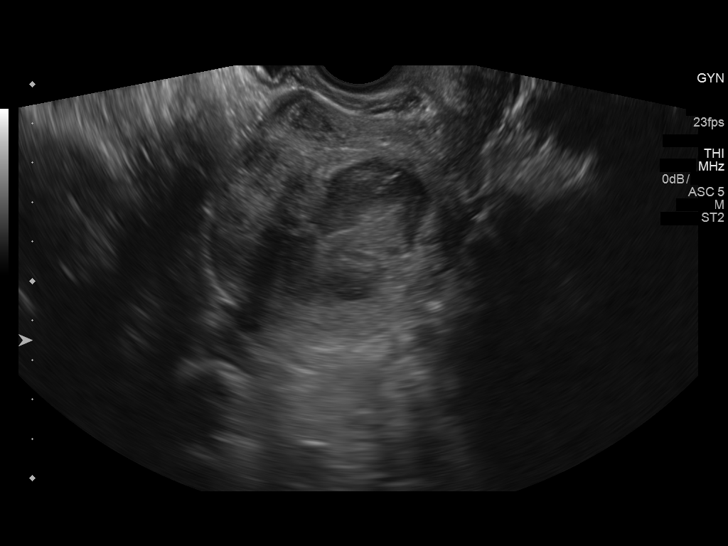
[im 100/100]
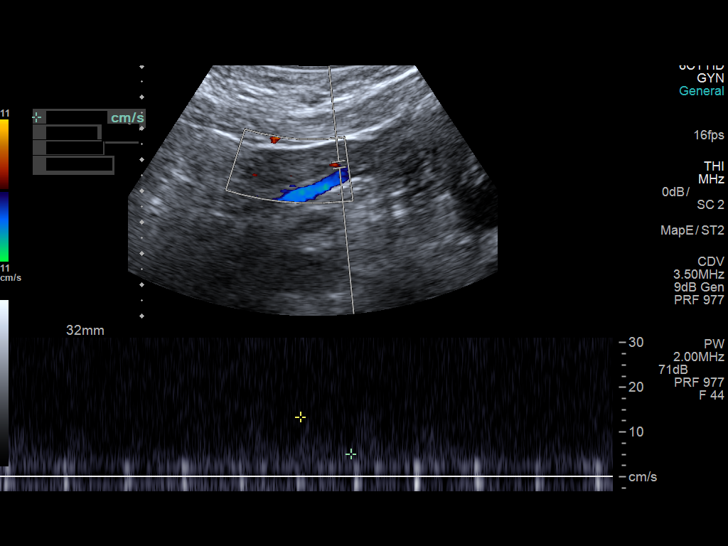

[13 of 25 positions shown; findings below may reference images not displayed]

FINDINGS: Uterus

Measurements: 9.8 x 7.2 x 8.4 cm.. Heterogenous echotexture.
Multiple myometrial masses, consistent with fibroids. The right
posterior fibroid measures 4.3 x 3.5 x 4.5 cm. Exophytic posterior
fundal fibroid measures 2.3 x 3.5 x 3 cm. Left anterior myometrial
fibroid measures 3 x 2.8 x 3.2 cm. Small fundal fibroid measures
x 1.3 x 2.5 cm.

Endometrium

Thickness: 4.3 mm. Complex fluid is present within the endometrial
canal.

Right ovary

Measurements: 3.3 x 1.2 x 2.6 cm. Normal appearance/no adnexal mass.

Left ovary

Measurements: 2.7 x 1.7 x 1.6 cm. Normal appearance/no adnexal mass.

Pulsed Doppler evaluation of both ovaries demonstrates normal
low-resistance arterial and venous waveforms.

Other findings

No abnormal free fluid.
IMPRESSION: 1. No sonographic evidence for ovarian torsion
2. Heterogenous uterus containing multiple fibroids.
3. Small to moderate complex fluid present in the endometrial canal,
may represent blood products or possible infected fluid.
